# Patient Record
Sex: Female | Born: 1970 | Race: White | Hispanic: No | Marital: Single | Smoking: Current some day smoker
Health system: Southern US, Community
[De-identification: ages and names within clinical notes are randomized; demographics above are authoritative.]

## PROBLEM LIST (undated history)

## (undated) DIAGNOSIS — I10 Essential (primary) hypertension: Secondary | ICD-10-CM

## (undated) DIAGNOSIS — R011 Cardiac murmur, unspecified: Secondary | ICD-10-CM

## (undated) DIAGNOSIS — R12 Heartburn: Secondary | ICD-10-CM

## (undated) DIAGNOSIS — T7840XA Allergy, unspecified, initial encounter: Secondary | ICD-10-CM

## (undated) DIAGNOSIS — Z86718 Personal history of other venous thrombosis and embolism: Secondary | ICD-10-CM

## (undated) DIAGNOSIS — D689 Coagulation defect, unspecified: Secondary | ICD-10-CM

## (undated) DIAGNOSIS — E119 Type 2 diabetes mellitus without complications: Secondary | ICD-10-CM

## (undated) DIAGNOSIS — K219 Gastro-esophageal reflux disease without esophagitis: Secondary | ICD-10-CM

## (undated) DIAGNOSIS — G4730 Sleep apnea, unspecified: Secondary | ICD-10-CM

## (undated) DIAGNOSIS — K469 Unspecified abdominal hernia without obstruction or gangrene: Secondary | ICD-10-CM

## (undated) DIAGNOSIS — M549 Dorsalgia, unspecified: Secondary | ICD-10-CM

## (undated) DIAGNOSIS — R7309 Other abnormal glucose: Secondary | ICD-10-CM

## (undated) DIAGNOSIS — F32A Depression, unspecified: Secondary | ICD-10-CM

## (undated) DIAGNOSIS — R7303 Prediabetes: Secondary | ICD-10-CM

## (undated) HISTORY — DX: Allergy, unspecified, initial encounter: T78.40XA

## (undated) HISTORY — DX: Gastro-esophageal reflux disease without esophagitis: K21.9

## (undated) HISTORY — DX: Sleep apnea, unspecified: G47.30

## (undated) HISTORY — PX: OTHER SURGICAL HISTORY: SHX169

## (undated) HISTORY — DX: Personal history of other venous thrombosis and embolism: Z86.718

## (undated) HISTORY — PX: TUBAL LIGATION: SHX77

## (undated) HISTORY — DX: Coagulation defect, unspecified: D68.9

## (undated) HISTORY — DX: Cardiac murmur, unspecified: R01.1

## (undated) HISTORY — DX: Heartburn: R12

## (undated) HISTORY — DX: Dorsalgia, unspecified: M54.9

## (undated) HISTORY — DX: Other abnormal glucose: R73.09

## (undated) HISTORY — DX: Depression, unspecified: F32.A

## (undated) HISTORY — DX: Prediabetes: R73.03

---

## 2004-07-31 ENCOUNTER — Emergency Department: Payer: Self-pay | Admitting: General Practice

## 2005-03-17 ENCOUNTER — Other Ambulatory Visit: Payer: Self-pay

## 2005-03-17 ENCOUNTER — Inpatient Hospital Stay: Payer: Self-pay | Admitting: Internal Medicine

## 2006-10-16 ENCOUNTER — Emergency Department: Payer: Self-pay | Admitting: Emergency Medicine

## 2006-12-01 ENCOUNTER — Emergency Department: Payer: Self-pay | Admitting: Emergency Medicine

## 2006-12-18 ENCOUNTER — Encounter: Payer: Self-pay | Admitting: Obstetrics and Gynecology

## 2006-12-25 ENCOUNTER — Encounter: Payer: Self-pay | Admitting: Maternal & Fetal Medicine

## 2007-01-08 ENCOUNTER — Encounter: Payer: Self-pay | Admitting: Maternal & Fetal Medicine

## 2007-02-22 ENCOUNTER — Observation Stay: Payer: Self-pay | Admitting: Obstetrics & Gynecology

## 2007-03-16 ENCOUNTER — Encounter: Payer: Self-pay | Admitting: Maternal & Fetal Medicine

## 2007-04-03 ENCOUNTER — Observation Stay: Payer: Self-pay

## 2007-04-09 ENCOUNTER — Encounter: Payer: Self-pay | Admitting: Maternal & Fetal Medicine

## 2007-05-11 ENCOUNTER — Encounter: Payer: Self-pay | Admitting: Maternal & Fetal Medicine

## 2007-05-14 ENCOUNTER — Encounter: Payer: Self-pay | Admitting: Maternal and Fetal Medicine

## 2007-05-22 ENCOUNTER — Inpatient Hospital Stay: Payer: Self-pay | Admitting: Obstetrics and Gynecology

## 2007-07-16 ENCOUNTER — Ambulatory Visit: Payer: Self-pay | Admitting: Obstetrics and Gynecology

## 2012-12-10 ENCOUNTER — Emergency Department: Payer: Self-pay | Admitting: Emergency Medicine

## 2012-12-10 LAB — COMPREHENSIVE METABOLIC PANEL
Albumin: 3.5 g/dL (ref 3.4–5.0)
Alkaline Phosphatase: 71 U/L (ref 50–136)
BUN: 15 mg/dL (ref 7–18)
Bilirubin,Total: 0.3 mg/dL (ref 0.2–1.0)
Calcium, Total: 8.7 mg/dL (ref 8.5–10.1)
Chloride: 105 mmol/L (ref 98–107)
Creatinine: 0.56 mg/dL — ABNORMAL LOW (ref 0.60–1.30)
EGFR (African American): >60
EGFR (Non-African Amer.): >60
Glucose: 90 mg/dL (ref 65–99)
Osmolality: 276 (ref 275–301)
Potassium: 3.8 mmol/L (ref 3.5–5.1)
SGPT (ALT): 25 U/L (ref 12–78)
Sodium: 138 mmol/L (ref 136–145)
Total Protein: 6.9 g/dL (ref 6.4–8.2)

## 2012-12-10 LAB — URINALYSIS, COMPLETE
Glucose,UR: NEGATIVE mg/dL (ref 0–75)
Ketone: NEGATIVE
Ph: 7 (ref 4.5–8.0)
Protein: NEGATIVE
RBC,UR: 9 /HPF (ref 0–5)
Specific Gravity: 1.018 (ref 1.003–1.030)
WBC UR: 58 /HPF (ref 0–5)

## 2012-12-10 LAB — CBC
MCH: 31.2 pg (ref 26.0–34.0)
MCHC: 34 g/dL (ref 32.0–36.0)
Platelet: 343 10*3/uL (ref 150–440)
RBC: 4.9 10*6/uL (ref 3.80–5.20)
RDW: 13.7 % (ref 11.5–14.5)

## 2012-12-10 LAB — HEMOGLOBIN: HGB: 14.8 g/dL (ref 12.0–16.0)

## 2013-11-29 ENCOUNTER — Emergency Department: Payer: Self-pay | Admitting: Emergency Medicine

## 2013-11-29 LAB — URINALYSIS, COMPLETE
BILIRUBIN, UR: NEGATIVE
Glucose,UR: NEGATIVE mg/dL (ref 0–75)
Ketone: NEGATIVE
NITRITE: NEGATIVE
Ph: 6 (ref 4.5–8.0)
Protein: NEGATIVE
RBC,UR: 6 /HPF (ref 0–5)
SPECIFIC GRAVITY: 1.024 (ref 1.003–1.030)
Squamous Epithelial: 3

## 2013-11-29 LAB — BASIC METABOLIC PANEL
Anion Gap: 4 — ABNORMAL LOW (ref 7–16)
BUN: 12 mg/dL (ref 7–18)
Calcium, Total: 8.2 mg/dL — ABNORMAL LOW (ref 8.5–10.1)
Chloride: 109 mmol/L — ABNORMAL HIGH (ref 98–107)
Co2: 28 mmol/L (ref 21–32)
Creatinine: 0.72 mg/dL (ref 0.60–1.30)
EGFR (African American): >60
EGFR (Non-African Amer.): >60
GLUCOSE: 90 mg/dL (ref 65–99)
OSMOLALITY: 281 (ref 275–301)
Potassium: 3.8 mmol/L (ref 3.5–5.1)
Sodium: 141 mmol/L (ref 136–145)

## 2013-11-29 LAB — CBC
HCT: 41.2 % (ref 35.0–47.0)
HGB: 13.8 g/dL (ref 12.0–16.0)
MCH: 31.5 pg (ref 26.0–34.0)
MCHC: 33.6 g/dL (ref 32.0–36.0)
MCV: 94 fL (ref 80–100)
PLATELETS: 286 10*3/uL (ref 150–440)
RBC: 4.4 10*6/uL (ref 3.80–5.20)
RDW: 13.9 % (ref 11.5–14.5)
WBC: 7 10*3/uL (ref 3.6–11.0)

## 2014-03-13 ENCOUNTER — Emergency Department: Payer: Self-pay | Admitting: Emergency Medicine

## 2014-03-13 LAB — BASIC METABOLIC PANEL
ANION GAP: 8 (ref 7–16)
BUN: 11 mg/dL (ref 7–18)
CHLORIDE: 106 mmol/L (ref 98–107)
CO2: 24 mmol/L (ref 21–32)
CREATININE: 0.68 mg/dL (ref 0.60–1.30)
Calcium, Total: 8.1 mg/dL — ABNORMAL LOW (ref 8.5–10.1)
EGFR (African American): >60
EGFR (Non-African Amer.): >60
Glucose: 90 mg/dL (ref 65–99)
Osmolality: 275 (ref 275–301)
POTASSIUM: 3.8 mmol/L (ref 3.5–5.1)
Sodium: 138 mmol/L (ref 136–145)

## 2014-03-13 LAB — CBC
HCT: 40.6 % (ref 35.0–47.0)
HGB: 13.6 g/dL (ref 12.0–16.0)
MCH: 31.6 pg (ref 26.0–34.0)
MCHC: 33.5 g/dL (ref 32.0–36.0)
MCV: 94 fL (ref 80–100)
Platelet: 315 10*3/uL (ref 150–440)
RBC: 4.3 10*6/uL (ref 3.80–5.20)
RDW: 14 % (ref 11.5–14.5)
WBC: 8.7 10*3/uL (ref 3.6–11.0)

## 2014-03-13 LAB — D-DIMER(ARMC): D-DIMER: 773 ng/mL

## 2014-03-13 LAB — TROPONIN I: Troponin-I: <0.02 ng/mL

## 2014-03-27 ENCOUNTER — Emergency Department: Payer: Self-pay | Admitting: Emergency Medicine

## 2014-03-27 LAB — URINALYSIS, COMPLETE
BACTERIA: NONE SEEN
Bilirubin,UR: NEGATIVE
Bilirubin,UR: NEGATIVE
GLUCOSE, UR: NEGATIVE mg/dL (ref 0–75)
Glucose,UR: NEGATIVE mg/dL (ref 0–75)
KETONE: NEGATIVE
KETONE: NEGATIVE
Leukocyte Esterase: NEGATIVE
Leukocyte Esterase: NEGATIVE
NITRITE: NEGATIVE
Nitrite: NEGATIVE
PROTEIN: NEGATIVE
Ph: 6 (ref 4.5–8.0)
Ph: 5 (ref 4.5–8.0)
Protein: NEGATIVE
RBC,UR: 10 /HPF (ref 0–5)
SPECIFIC GRAVITY: 1.025 (ref 1.003–1.030)
Specific Gravity: 1.025 (ref 1.003–1.030)
Squamous Epithelial: 1
WBC UR: 6 /HPF (ref 0–5)

## 2014-03-27 LAB — CBC WITH DIFFERENTIAL/PLATELET
BASOS ABS: 0.1 10*3/uL (ref 0.0–0.1)
Basophil %: 0.5 %
Eosinophil #: 0.2 10*3/uL (ref 0.0–0.7)
Eosinophil %: 1.4 %
HCT: 38 % (ref 35.0–47.0)
HGB: 12.9 g/dL (ref 12.0–16.0)
Lymphocyte #: 1.8 10*3/uL (ref 1.0–3.6)
Lymphocyte %: 15.6 %
MCH: 32 pg (ref 26.0–34.0)
MCHC: 34 g/dL (ref 32.0–36.0)
MCV: 94 fL (ref 80–100)
MONOS PCT: 7.2 %
Monocyte #: 0.8 x10 3/mm (ref 0.2–0.9)
Neutrophil #: 8.9 10*3/uL — ABNORMAL HIGH (ref 1.4–6.5)
Neutrophil %: 75.3 %
Platelet: 310 10*3/uL (ref 150–440)
RBC: 4.04 10*6/uL (ref 3.80–5.20)
RDW: 13.8 % (ref 11.5–14.5)
WBC: 11.8 10*3/uL — ABNORMAL HIGH (ref 3.6–11.0)

## 2014-03-27 LAB — COMPREHENSIVE METABOLIC PANEL
ALBUMIN: 2.8 g/dL — AB (ref 3.4–5.0)
ALK PHOS: 52 U/L
ALT: 25 U/L
ANION GAP: 10 (ref 7–16)
AST: 18 U/L (ref 15–37)
BUN: 14 mg/dL (ref 7–18)
Bilirubin,Total: 0.1 mg/dL — ABNORMAL LOW (ref 0.2–1.0)
Calcium, Total: 8.7 mg/dL (ref 8.5–10.1)
Chloride: 107 mmol/L (ref 98–107)
Co2: 21 mmol/L (ref 21–32)
Creatinine: 0.77 mg/dL (ref 0.60–1.30)
GLUCOSE: 125 mg/dL — AB (ref 65–99)
OSMOLALITY: 278 (ref 275–301)
Potassium: 3.3 mmol/L — ABNORMAL LOW (ref 3.5–5.1)
Sodium: 138 mmol/L (ref 136–145)
Total Protein: 6.5 g/dL (ref 6.4–8.2)

## 2014-03-27 LAB — HCG, QUANTITATIVE, PREGNANCY: BETA HCG, QUANT.: 82695 m[IU]/mL — AB

## 2014-03-28 LAB — WET PREP, GENITAL

## 2014-04-18 ENCOUNTER — Encounter: Payer: Self-pay | Admitting: Obstetrics and Gynecology

## 2014-04-25 ENCOUNTER — Encounter: Payer: Self-pay | Admitting: Maternal & Fetal Medicine

## 2014-04-25 LAB — CBC WITH DIFFERENTIAL/PLATELET
Basophil #: 0.1 10*3/uL (ref 0.0–0.1)
Basophil %: 1.1 %
EOS PCT: 1.5 %
Eosinophil #: 0.1 10*3/uL (ref 0.0–0.7)
HCT: 38.9 % (ref 35.0–47.0)
HGB: 13.1 g/dL (ref 12.0–16.0)
Lymphocyte #: 1.8 10*3/uL (ref 1.0–3.6)
Lymphocyte %: 21.6 %
MCH: 31.4 pg (ref 26.0–34.0)
MCHC: 33.6 g/dL (ref 32.0–36.0)
MCV: 94 fL (ref 80–100)
MONOS PCT: 9.7 %
Monocyte #: 0.8 x10 3/mm (ref 0.2–0.9)
Neutrophil #: 5.5 10*3/uL (ref 1.4–6.5)
Neutrophil %: 66.1 %
PLATELETS: 303 10*3/uL (ref 150–440)
RBC: 4.16 10*6/uL (ref 3.80–5.20)
RDW: 13.2 % (ref 11.5–14.5)
WBC: 8.4 10*3/uL (ref 3.6–11.0)

## 2014-04-25 LAB — HEPARIN LEVEL (UNFRACTIONATED): Anti-Xa(Unfractionated): 0.11 IU/mL — ABNORMAL LOW (ref 0.30–0.70)

## 2014-05-05 ENCOUNTER — Encounter: Payer: Self-pay | Admitting: Maternal & Fetal Medicine

## 2014-05-05 LAB — CBC WITH DIFFERENTIAL/PLATELET
BASOS PCT: 0.9 %
Basophil #: 0.1 10*3/uL (ref 0.0–0.1)
EOS PCT: 2.1 %
Eosinophil #: 0.2 10*3/uL (ref 0.0–0.7)
HCT: 38.2 % (ref 35.0–47.0)
HGB: 12.7 g/dL (ref 12.0–16.0)
Lymphocyte #: 1.8 10*3/uL (ref 1.0–3.6)
Lymphocyte %: 19.4 %
MCH: 30.9 pg (ref 26.0–34.0)
MCHC: 33.2 g/dL (ref 32.0–36.0)
MCV: 93 fL (ref 80–100)
MONO ABS: 0.9 x10 3/mm (ref 0.2–0.9)
Monocyte %: 10.2 %
Neutrophil #: 6.1 10*3/uL (ref 1.4–6.5)
Neutrophil %: 67.4 %
PLATELETS: 304 10*3/uL (ref 150–440)
RBC: 4.1 10*6/uL (ref 3.80–5.20)
RDW: 13.4 % (ref 11.5–14.5)
WBC: 9 10*3/uL (ref 3.6–11.0)

## 2014-05-19 ENCOUNTER — Encounter: Payer: Self-pay | Admitting: Obstetrics & Gynecology

## 2014-06-06 ENCOUNTER — Encounter: Payer: Self-pay | Admitting: Maternal & Fetal Medicine

## 2014-08-01 ENCOUNTER — Encounter: Payer: Self-pay | Admitting: Obstetrics and Gynecology

## 2014-08-19 ENCOUNTER — Observation Stay: Payer: Self-pay | Admitting: Obstetrics and Gynecology

## 2014-08-19 LAB — URINALYSIS, COMPLETE
BILIRUBIN, UR: NEGATIVE
BLOOD: NEGATIVE
GLUCOSE, UR: NEGATIVE mg/dL (ref 0–75)
LEUKOCYTE ESTERASE: NEGATIVE
Nitrite: NEGATIVE
Ph: 6 (ref 4.5–8.0)
RBC,UR: 12 /HPF (ref 0–5)
SPECIFIC GRAVITY: 1.024 (ref 1.003–1.030)
WBC UR: 4 /HPF (ref 0–5)

## 2014-08-19 LAB — WET PREP, GENITAL

## 2014-08-26 ENCOUNTER — Observation Stay: Payer: Self-pay | Admitting: *Deleted

## 2014-08-26 LAB — URINALYSIS, COMPLETE
BILIRUBIN, UR: NEGATIVE
GLUCOSE, UR: NEGATIVE mg/dL (ref 0–75)
LEUKOCYTE ESTERASE: NEGATIVE
NITRITE: NEGATIVE
PROTEIN: NEGATIVE
Ph: 6 (ref 4.5–8.0)
RBC,UR: 10 /HPF (ref 0–5)
Specific Gravity: 1.024 (ref 1.003–1.030)
Squamous Epithelial: 12
WBC UR: 2 /HPF (ref 0–5)

## 2014-09-12 ENCOUNTER — Ambulatory Visit: Payer: Self-pay | Admitting: Obstetrics and Gynecology

## 2014-09-29 ENCOUNTER — Observation Stay: Payer: Self-pay | Admitting: Obstetrics and Gynecology

## 2014-09-29 LAB — PIH PROFILE
AST: 16 U/L (ref 15–37)
Anion Gap: 8 (ref 7–16)
BUN: 11 mg/dL (ref 7–18)
CHLORIDE: 107 mmol/L (ref 98–107)
CREATININE: 0.46 mg/dL — AB (ref 0.60–1.30)
Calcium, Total: 8.4 mg/dL — ABNORMAL LOW (ref 8.5–10.1)
Co2: 25 mmol/L (ref 21–32)
GLUCOSE: 85 mg/dL (ref 65–99)
HCT: 34.4 % — AB (ref 35.0–47.0)
HGB: 11.2 g/dL — AB (ref 12.0–16.0)
MCH: 27 pg (ref 26.0–34.0)
MCHC: 32.6 g/dL (ref 32.0–36.0)
MCV: 83 fL (ref 80–100)
OSMOLALITY: 278 (ref 275–301)
PLATELETS: 281 10*3/uL (ref 150–440)
Potassium: 4.3 mmol/L (ref 3.5–5.1)
RBC: 4.14 10*6/uL (ref 3.80–5.20)
RDW: 15.5 % — ABNORMAL HIGH (ref 11.5–14.5)
Sodium: 140 mmol/L (ref 136–145)
URIC ACID: 4.1 mg/dL (ref 2.6–6.0)
WBC: 8.9 10*3/uL (ref 3.6–11.0)

## 2014-09-29 LAB — PROTEIN / CREATININE RATIO, URINE
Creatinine, Urine: 110.9 mg/dL (ref 30.0–125.0)
Protein, Random Urine: 29 mg/dL — ABNORMAL HIGH (ref 0–12)
Protein/Creat. Ratio: 261 mg/gCREAT — ABNORMAL HIGH (ref 0–200)

## 2014-10-03 ENCOUNTER — Inpatient Hospital Stay: Payer: Self-pay

## 2014-10-03 LAB — CBC WITH DIFFERENTIAL/PLATELET
BASOS ABS: 0 10*3/uL (ref 0.0–0.1)
Basophil %: 0.5 %
EOS ABS: 0.1 10*3/uL (ref 0.0–0.7)
Eosinophil %: 1 %
HCT: 33.3 % — ABNORMAL LOW (ref 35.0–47.0)
HGB: 10.9 g/dL — ABNORMAL LOW (ref 12.0–16.0)
LYMPHS PCT: 18.7 %
Lymphocyte #: 1.7 10*3/uL (ref 1.0–3.6)
MCH: 27 pg (ref 26.0–34.0)
MCHC: 32.8 g/dL (ref 32.0–36.0)
MCV: 82 fL (ref 80–100)
MONOS PCT: 11.8 %
Monocyte #: 1.1 x10 3/mm — ABNORMAL HIGH (ref 0.2–0.9)
NEUTROS ABS: 6.1 10*3/uL (ref 1.4–6.5)
NEUTROS PCT: 68 %
PLATELETS: 292 10*3/uL (ref 150–440)
RBC: 4.05 10*6/uL (ref 3.80–5.20)
RDW: 16.1 % — AB (ref 11.5–14.5)
WBC: 9 10*3/uL (ref 3.6–11.0)

## 2014-10-03 LAB — PROTEIN / CREATININE RATIO, URINE
Creatinine, Urine: 193.6 mg/dL — ABNORMAL HIGH (ref 30.0–125.0)
PROTEIN/CREAT. RATIO: 207 mg/g{creat} — AB (ref 0–200)
Protein, Random Urine: 40 mg/dL — ABNORMAL HIGH (ref 0–12)

## 2014-10-03 LAB — PROTIME-INR
INR: 0.9
Prothrombin Time: 12.7 secs

## 2014-10-03 LAB — APTT

## 2014-11-08 ENCOUNTER — Ambulatory Visit: Payer: Self-pay | Admitting: Obstetrics & Gynecology

## 2014-11-11 ENCOUNTER — Ambulatory Visit: Payer: Self-pay | Admitting: Obstetrics & Gynecology

## 2014-12-17 NOTE — Consult Note (Signed)
Referral Information:  Reason for Referral Pregnant, history of Pulmonary embolism   Referring Physician [facility]   Prenatal Hx Sabrina Fuentes is a 44 year-old G5 P3104 at 76 1/7 weeks (by 9 wk [place]) who presents for recommendations concerning the management of anticoagulation in pregnancy. She was followed by our group in 2008 during her most recent pregnancy. During that pregnancy she utilized an intermediate dosing of Lovenox (30 mg every 12 hours, increasing to 40 mg every 12 hours and transitioned to UFH at 36 wks) without difficulty. She had no bleeding comlications and did not have recurrence of VTE.  She presented in 2006 to [facility] with a pulmonary embolism. She was admitted for one week, initially maintained on Lovenox and then completed six months of coumadin anticoagulation. At the time of presentation, she was smoking and on combination contraception. Her thrombophilia evaluation was negative.  Currently, she is doing well. She had an [place] with [facility] which documented a live IUP. She denies vaginal bleeding or abdominal pain.   Past Obstetrical Hx G5 P3104 1997: spontaneous vaginal delivery at 38 weeks, spontaneous labor. Female infant, 7 pounds, no complications 7169: spontaneous vaginal delivery at 36 weeks, spontaneous labor, female infant, 6 pounds 2 ounces, no complications 6789: spontaneous vaginal delivery at 39 weeks, spontaneous labor, female infant, 8 pounds, no complications 3810: spontaneous vaginal delivery at 39 weeks, Induction of labor due to anticoagulation, Female infant, 7 pounds 1 ounce, no complications.   Home Medications: Medication Instructions Status  Prenatal Multivitamins Prenatal Multivitamins oral tablet 1 tab(s) orally once a day Active   Allergies:   Penicillin: Hives  IVP Dye: Hives  Ampicillin: Hives  ASA: Unknown  Vital Signs/Notes:  Nursing Vital Signs: **Vital Signs.:   24-Aug-15 09:15  Pulse Pulse 93  Systolic BP Systolic BP 175  Diastolic BP  (mmHg) Diastolic BP (mmHg) 55  Pulse Ox % Pulse Ox % 98   Perinatal Consult:  PGyn Hx Denies history of abnormal paps, STDS, or procedurs on cervix   Past Medical History cont'd Pulmonary Embolism as in HPI Denies history of hypertension, diabetes or thyroid disorders   PSurg Hx Tonsillectomy as teenager. No abdominal surgery   FHx Deneis FH of birth defects, mental retardation or genetic disorders.  Maternal Grandmother with multiple strokes and PE at old age. No other FH of VTE   Occupation [employer]   Soc Hx Denies use of ETOH or drugs. Smoking 2-3 cigs per day.   Review Of Systems:  Subjective No complaints. No vaginal bleeding. No abdominal pain.   Fever/Chills No   Abdominal Pain No   Nausea/Vomiting No   SOB/DOE No   Chest Pain No   Exam:  Today's Weight 259, BMI 44.4    Additional Lab/Radiology Notes Prenatal labs [facility],  8/17-8/18/15): Blood type A positive, antibody screen negative, RPR non-reactive, Hep B neg, HIV non-reactive, Rubella immune, Hct 38.1, Plt 361, MCV 93 Early glucola 113 Cell-free fetal DNA: Normal, findings consistent with female fetus   Impression/Recommendations:  Impression 44 year-old G5 P3104 at 55 1/7 weeks with history of pulmonary embolism while on combination oral contraception in 2006. She had a successful pregnancy in 2008 while on an intermediate dosing of Lovenox. In addition, she is 44 years-old (Advanced Maternal Age) and obese (BMI over 40).   Recommendations 1. Advanced Maternal Age at 65. We discussed the age-associated risk for fetal aneuploidy and available screening and diagnostic tests avaialble, including amniocentesis. We also discussed availability of genetic counseling.  She had a normal cell-free fetal DNA testing performed at [place]. She declines genetic counseling. We discussed false-positive and false-negative reates associated with cell-free fetal DNA testing.  We also discussed medical  complications in pregnancy associated with Advanced Maternal Age including diabetes and hypertensive disorders of pregnancy. She reports having had a normal early glucola this pregnancy at [place]. -Declines Genetic Counseling -We have scheduled a detailed [place] to be done at 17-18 weeks at [facility]. Cell-free fetal DNA testing is negative.  2. Obesity. We discussed complications associated with obesity in pregnancy and goal weight gain.  ...continued below....    Comments ...continued from above....  3. History of Pulmonary Embolism. Pt has a history of pulmonary embolism in 2006 while smoking and taking combination oral contraception. She did well with intermediate dosing of Lovenox during her pregnancy in 2008. As her BMI is 44, I am recommended utilizing an intermediate dosing of Lovenox again. We discussed risks and benefits of anticoagulation in pregnancy.  She had a negative inherited and acquired thrombophila panel with [place] in 2008 -STOP smoking. We discussed the increased risk for poor pregnancy outcomes as well as recurrent VTE with continued smoking. -Start Lovenox 30 mg every 12 hours today. A script was provded and she declined teaching as she had used prior -RTC in one week for CBC and LMWH level -RTC in two weeks for CBC -RTC at 28 weeks to increase her Lovenox to 40 mg every 12 hours. This appointment was provided -We will then transition her to unfractionated heparin at 36 weeks -Plan delivery at 39 weeks, having held her heparin the mornign of induction -SCDs on in labor -Restart Lovenox at 40 mg once daily, 12 hours after a vaginal delivery or 24 hours after a cesarean delivery. Continue for six weeks postpartum -Avoid estrogen-containting contraception -Follow fetal growth with monthly ultrasounds starting at about 28 weeks -Weekly fetal testing to begin at 32 weeks, increasing to twice weekly at 34-36 weeks -Delivery in her 39th week as  above    Total Time Spent with Patient 60 minutes   >50% of visit spent in couseling/coordination of care yes   Office Use Only 99244  Level 4 (85min) NEW office consult low complexity   Coding Description: MATERNAL CONDITIONS/HISTORY INDICATION(S).   Bleeding Disorder and/or Pt on heparin/coumadin/lovenox.   Pulmonary emboli.  Electronic Signatures: Dessirae Scarola, [place] (MD)  (Signed 24-Aug-15 10:50)  Authored: Referral, Home Medications, Allergies, Vital Signs/Notes, Consult, Exam, Lab/Radiology Notes, Impression, Other Comments, Billing, Coding Description   Last Updated: 24-Aug-15 10:50 by Treva Huyett, [place] (MD)

## 2014-12-17 NOTE — Consult Note (Signed)
Referral Information:  Reason for Referral Pregnant, history of Pulmonary embolism, smoker, obesity   Referring Physician [facility]   Prenatal Hx Sabrina Fuentes is a 44 year-old G5 P3104 at 53 1/7 weeks (by 9 wk [place]) who presents for follow up  concerning the management of anticoagulation in pregnancy. She saw Dr Claybon Jabs at 13 weeks, who recommended management similar to her prior pregnancy given multiple risk factors. She was followed by our group in 2008 during her last pregnancy. During that pregnancy she utilized an intermediate dosing of Lovenox (30 mg every 12 hours, increasing to 40 mg every 12 hours and transitioned to UFH at 36 wks) without difficulty. She had no bleeding comlications and did not have recurrence of VTE. She reports no bleedign or complicaitons of Lovenox use. She presented in 2006 to [facility] with a pulmonary embolism. She was admitted for one week, initially maintained on Lovenox and then completed six months of coumadin anticoagulation. At the time of presentation, she was smoking and on combination contraception. Her thrombophilia evaluation was negative.  Today she will undergo her growth scan at [facility] and will have her third trimester labs. She reports a 29 lb weight gain - she is puzzle das she tries to eat healthy food. She smokes 2 cigs/day - her husband continues to smoke which is an obstacle to quitting . She declines further assistance with [place] or other options.   Past Obstetrical Hx G5 P3104 1997: spontaneous vaginal delivery at 38 weeks, spontaneous labor. Female infant, 7 pounds, no complications 7564: spontaneous vaginal delivery at 36 weeks, spontaneous labor, female infant, 6 pounds 2 ounces, no complications 3329: spontaneous vaginal delivery at 39 weeks, spontaneous labor, female infant, 8 pounds, no complications 5188: spontaneous vaginal delivery at 39 weeks, Induction of labor due to anticoagulation, Female infant, 7 pounds 1 ounce, no complications.    Home Medications: Medication Instructions Status  Prenatal Multivitamins Prenatal Multivitamins oral tablet 1 tab(s) orally once a day Active  Lovenox 30 mg/0.3 mL injectable solution 30 milligram(s) injectable 2 times a day Active  Zantac 150 150 mg oral tablet 1 tab(s) orally 2 times a day, As Needed - for Itching, for Indigestion, Heartburn  Active   Allergies:   Penicillin: Hives  IVP Dye: Hives  Ampicillin: Hives  ASA: Unknown  Vital Signs/Notes:  Nursing Vital Signs:  **Vital Signs.:   07-Dec-15 11:05  Vital Signs Type Routine  Temperature Temperature (F) 97.8  Celsius 36.5  Temperature Source oral  Pulse Pulse 102  Respirations Respirations 18  Systolic BP Systolic BP 416  Diastolic BP (mmHg) Diastolic BP (mmHg) 68  Mean BP 88  Pulse Ox % Pulse Ox % 97  Pulse Ox Activity Level  At rest  Oxygen Delivery Room Air/ 21 %   Perinatal Consult:  PGyn Hx Denies history of abnormal paps, STDS, or procedurs on cervix   Past Medical History cont'd Pulmonary Embolism as in HPI Denies history of hypertension, diabetes or thyroid disorders   PSurg Hx Tonsillectomy as teenager. No abdominal surgery   FHx Deneis FH of birth defects, mental retardation or genetic disorders.  Maternal Grandmother with multiple strokes and PE at old age. No other FH of VTE   Occupation [employer]   Soc Hx Denies use of ETOH or drugs. Smoking 2/ cigs per day.   Review Of Systems:  Subjective No complaints. No vaginal bleeding. No abdominal pain.      SOB/DOE No   Chest Pain No   Exam:  Today's Weight 288 29 lb incre    Additional Lab/Radiology Notes Prenatal labs [facility],  8/17-8/18/15): Blood type A positive, antibody screen negative, RPR non-reactive, Hep B neg, HIV non-reactive, Rubella immune, Hct 38.1, Plt 361, MCV 93 Early glucola 113 Cell-free fetal DNA: Normal, findings consistent with female fetus last LMWH level 0.11 - (0,3-0.7) low as antivcipated for  prophylactic dosing   Impression/Recommendations:  Impression 44 year-old G5 P3104 at 36 1/7 weeks with: 1- history of pulmonary embolism while on combination oral contraception in 2006. She had a successful pregnancy in 2008 while on an intermediate dosing of Lovenox. In addition, she is 44 years-old (Advanced Maternal Age) and obese (BMI over 40). Plan for intermediate dosing this pregnancy . Here to increase dosing  2 Adv Mat age - normal cell free DNA - female  3 Obesity - greater than advised weight gain - declines dietitian referral  4 Smoker has cut back to 2/day - declines further assistance   Recommendations 1 Increase Lovenox dose to 40 mg BID - recheck level and plt count in one week - put on Rx to have it doen at [facility] - pt works at [employer] , will switch to unfractionated at 36 weeks and indcution after holding at 39 weeks  2- check TSH with GCT today - greater than expected weight gain, encouraged dietary limitation 3 encouraged limiting smoking  4 serial scans for growth   Plan:  Ultrasound at what gestational ages Monthly > 28 weeks   Antepartum Testing Weekly, Starting at 88 to 36 weeks   Delivery Mode Vaginal   Additional Testing Thyroid panel, LMWH level /CBC in one week   Delivery at what gestational age [redacted] weeks   (Removed):     Total Time Spent with Patient 15 minutes   >50% of visit spent in couseling/coordination of care yes   Office Use Only 99213  Office Visit Level 3 (73min) EST exp prob focused outpt   Coding Description: MATERNAL CONDITIONS/HISTORY INDICATION(S).   Bleeding Disorder and/or Pt on heparin/coumadin/lovenox.   Pulmonary emboli.  Electronic Signatures: Sharyn Creamer (MD)  (Signed 07-Dec-15 11:59)  Authored: Referral, Home Medications, Allergies, Vital Signs/Notes, Consult, Exam, Lab/Radiology Notes, Impression, Plan, Other Comments, Billing, Coding Description   Last Updated: 07-Dec-15 11:59 by Sharyn Creamer (MD)

## 2014-12-19 LAB — SURGICAL PATHOLOGY

## 2014-12-25 NOTE — Op Note (Signed)
PATIENT NAME:  Sabrina Fuentes, Sabrina Fuentes MR#:  [id number] DATE OF BIRTH:  04/07/1971  DATE OF PROCEDURE:  11/11/2014  PREOPERATIVE DIAGNOSIS: Desired sterilization.   POSTOPERATIVE DIAGNOSIS:  Desired sterilization.  PROCEDURE:  A single incision laparoscopic bilateral salpingectomy  ANESTHESIA:  General.   SURGEON: Dr. Vikki Ports Ward.  ESTIMATED BLOOD LOSS: Minimal.   OPERATIVE FLUIDS:  1000 mL of crystalloid.   FINDINGS: Normal-appearing uterus, tubes and ovaries.   SPECIMENS: Right tube and left tube.   COMPLICATIONS: None apparent.   DISPOSITION: Stable to PACU.   INDICATION FOR PROCEDURE: Ms. Weinhold is a 44 year old female who is recently status post a spontaneous vaginal delivery. She was not eligible for a postpartum tubal ligation, however.  She elected for an interim tubal ligation via laparoscopic approach. She had consented to this procedure was brought in on the day of surgery.   OPERATIVE NOTE: The patient was brought back to the operating room where she was identified as Sabrina Fuentes.  She was given endotracheal intubation for general anesthesia delivery and placed in the dorsal lithotomy position in [place]. She was prepped and draped in the usual sterile fashion and then a surgical timeout was called. The surgeon's hand was placed into the vagina and a single-tooth tenaculum was placed on the cervix. It was brought forward and then a uterine sound was placed into the uterus and taped to the tenaculum for retraction. A Foley catheter was then placed.  A change of gloves was performed and then the attention was turned to the abdominal cavity. The belly button was deep and Allis clamps were used to lift it, and an 11 blade was used to incise the skin in a vertical fashion through the belly button.  The underlying tissues were dissected and Kocher clamps were used to grasp the fascia which was divided with Mayo scissors, and then further divided cephalad and caudad in order to  accommodate the GelPOINT single site incision applicator. The Ubaldo Glassing was inserted and retracted and the GelPOINT device was attached. The pneumoperitoneum was created.  Cursory and brief evaluation of the upper abdominal cavity was negative for any interesting finding and the attention was turned towards the pelvis. The patient was placed in steep Trendelenburg. The uterus was retracted anteriorly, and the right fallopian tube was identified, grasped at its fimbriated end, and the LigaSure was then used to dissect the mesosalpinx to the cornua without difficulty.  This was placed in the anterior cul-de-sac, and then the attention was turned to the left tube which was again grasped at its fimbriated end, and the mesosalpinx from the fimbriated end to the cornua was then dissected using the LigaSure.  The peritoneum was then deflated and then re-inflated to assess for hemostasis. There was no bleeding at either site of the surgery. The tubes were then grasped and brought through the incision and handed off to nursing to be sent to pathology.  The GelPOINT device was then removed in its entirety. Kocher clamps were then used to grasp the fascia on both of the left and right side of the umbilicus. The fascia was then closed with a running stitch of 0 Vicryl, and then the skin was closed using 4-0 Monocryl.  It should be mentioned that there was a good bit of difficulty in closing the fascia as the patient's fascia was very deep; however, it was accomplished and the surgeon's finger tested integrity.  The patient tolerated the procedure. The counts were correct x 2, and then the patient  was brought back to PACU in a stable condition for recovery.    ____________________________ Honor Loh Ward, MD ccw:sp D: 11/13/2014 13:07:07 ET T: 11/13/2014 13:47:53 ET JOB#: [id number]  cc: Vikki Ports C. Ward, MD, <Dictator> Maceo Pro MD ELECTRONICALLY SIGNED 12/15/2014 23:42

## 2015-01-03 NOTE — H&P (Signed)
L&D Evaluation:  History:  HPI 44 yo [id number] WF with EDC=10/24/2014 by LMP/12 wk scan who presents at 37 weeks for IOL for gestational hypertension. Sent from office last week at 36.[redacted] weeks gestation after having a 6-7 minute deceleration during a NST. She also had an elevated blood pressure to 140s/90s. and recent onset headache. She was monitored x 4 hours and had a nicely reactive strip without further decels. She had some blood pressures in the mild range but PIH labs were normal. She also was begun on glyburide 2.5 mgm BID because the majority of her blood sugars were elevated on her GDM diet. Her PNC at [facility]  was also complicated by  Obesity with BMI of 45 and a 31# weight gain,  AMA (normal Informaseq),  history of PE in 2006 currently on intermediate-dosing lovenox at 40mg  bid, and smoking.  She denies current headache, visual changes, SOB, or CP. She has had irregular contractions, but no VB. Has had LOF with coughing but she thinks it is SUI.  She notes positive fetal movement. Has had congestion and a cough recently. Denies fever. Hx of SVD x4. LABS: A POS/RI/VI/ GBS positive   Presents with gestational hypertension for IOL   Patient's Medical History 1) morbid obesity, 2) AMA, 3) history of PE. 4)GDM   Patient's Surgical History Tonsilectomy   Medications Pre Natal Vitamins  Lovenox 40mg  BID ( last dose 2/7 AM), Zantac 150 mgm prn, glyburide 2.5 mgm (last dose 2/7 PM)   Allergies PCN, ASA, IVP dye   Social History tobacco  1/2 PPD   Family History Non-Contributory   ROS:  ROS see HPI   Exam:  Vital Signs 148/85, 148/66   General no apparent distress   Mental Status clear   Chest rhonchi in upper lobes resolved with cough   Heart normal sinus rhythm, no murmur/gallop/rubs   Abdomen gravid, non-tender   Estimated Fetal Weight 6#10 on recent ultrasound. AFI=19 09/26/14   Fetal Position vtx on [place] today   Edema 1+   Reflexes 2+   Pelvic no external lesions,  FT/25-50%/-4   Mebranes Intact   FHT normal rate with no decels, 135-140 with accels to 160s   FHT Description mod variability   Ucx occ   Skin dry   Impression:  Impression IUP at 37 weeks with GHTN-for IOL/unripe cx   Plan:  Plan antibiotics for GBBS prophylaxis, PIH labs   Comments Discussed method of IOL with Dr Leonides Schanz in light of prolonged fetal heart rate decel last week. Will start with Cervidil. Discussed with patient risks of hyperstimulation, FITL, Cesarean section, and failed IOL. Patient is advised that most probably will need serial induction given unripe cx. Patient wishes to proceed with induction.  2.) GDMA2-FSBS 2 hr pp was 117. Will get FSBS q2hr 3.) Hx of PE/on Lovenox-PT, PTT ordered. Start SCDs when on bedrest.   Electronic Signatures: Karene Fry (CNM)  (Signed 08-Feb-16 17:42)  Authored: L&D Evaluation   Last Updated: 08-Feb-16 17:42 by Karene Fry (CNM)

## 2015-01-03 NOTE — H&P (Signed)
L&D Evaluation:  History:  HPI 44 yo [id number] WF with EDC=10/24/2014 by LMP/12 wk scan who presents from office at 36.[redacted] weeks gestation after having a 6-7 minute deceleration in the office during a NST for extended monitoring. She also had an elevated blood pressure to 140s/90s. and a headache since yesterday. Her PNC at [facility] is complicated by  Obesity with BMI of 45 and a 31# weight gain,  AMA (normal Informaseq),  history of PE in 2006 currently on intermediate-dosing lovenox at 40mg  bid, and smoking. In addition she has GDM and the majority of FSBSs have been elevated per CNM [place].  She denies visual changes, SOB, or CP. She has had irregular contractions, but no VB. Has had LOF with coughing but she thinks it is SUI.  She notes positive fetal movement. Has had congestion and a cough recently. Denies fever. Hx of SVD x4. LABS: A POS/RI/VI   Presents with prolonged deceleration in office and elevated blood pressure   Patient's Medical History 1) morbid obesity, 2) AMA, 3) history of PE. 4)GDM   Patient's Surgical History Tonsilectomy   Medications Pre Natal Vitamins  Lovenox 40mg  BID, Zantac 150 mgm   Allergies PCN, ASA, IVP dye   Social History tobacco  1/2 PPD   Family History Non-Contributory   ROS:  ROS see HPI   Exam:  Vital Signs 146/75, 147/81, 153/87, 137/77, 143/78, 152/61   General no apparent distress   Mental Status clear   Chest wheezing in upper lobes bilaterally   Heart normal sinus rhythm, no murmur/gallop/rubs   Abdomen gravid, non-tender   Estimated Fetal Weight 6#10 on recent ultrasound. AFI=19 09/26/14   Edema 1+   Reflexes 2+   Pelvic no external lesions, cervix closed and thick, Baby OOP   Mebranes Intact   FHT normal rate with no decels   Ucx none seen   Skin dry   Impression:  Impression IUP at 36.3 weeks with prolonged decel in office-reactive tracing here so far.   Plan:  Comments Continue to monitor. 2) Elevated blood pressures in  the mild range. PIH workup. P/C ratio 3) GDMA-blood sugars elevated in office- consider starting glyburide 4) on Lovenox for hx of PE-SCDs if needing prolonged monitoring. 5.) NPO for now except ice chips-consider clear liquids if no further FHR decels   Electronic Signatures: Karene Fry (CNM)  (Signed 04-Feb-16 12:46)  Authored: L&D Evaluation   Last Updated: 04-Feb-16 12:46 by Karene Fry (CNM)

## 2015-01-03 NOTE — H&P (Signed)
L&D Evaluation:  History Expanded:  HPI 44 yo [id number] WF at [redacted]w[redacted]d gestational age by LMP/12 wk scan whose pregnancy is complicated by Obesity with BMI of 45, AMA, history of PE in 2006 currently on intermediate-dosing lovenox at 40mg  bid.  She presents with trickling of fluid about 1.5 hours prior to presentation.  It ran down her leg, she wiped it off, and it ran down her leg more.  She never noted a large gush of fluid nor vaginal bleeding. She deneis contractions.  She notes positive fetal movement.  She receives her prenatal care at [facility].   Gravida 5   Term 3   PreTerm 1   Abortion 0   Living 4   Blood Type (Maternal) A positive   Group B Strep Results Maternal (Result >5wks must be treated as unknown) unknown/result > 5 weeks ago   Maternal HIV Negative   Maternal Syphilis Ab Nonreactive   Maternal Varicella Immune   Rubella Results (Maternal) immune   EDC 24-Oct-2014   Patient's Medical History 1) morbid obesity, 2) AMA, 3) history of PE   Patient's Surgical History Tonsilectomy   Medications Pre Natal Vitamins  Lovenox 40mg  BID   Allergies NKDA   Social History PCN, ASA, IVP Dye   Family History Non-Contributory   Current Prenatal Course Notable For h/o PE   ROS:  ROS All systems were reviewed.  HEENT, CNS, GI, GU, Respiratory, CV, Renal and Musculoskeletal systems were found to be normal., unless noted in HPI   Exam:  Vital Signs stable  AFVSS   General no apparent distress   Mental Status clear   Chest clear   Heart normal sinus rhythm   Abdomen gravid, non-tender   Back no CVAT   Edema no edema   Pelvic no external lesions, FT/long/high,  Thick-whitish discharge   Mebranes Intact   FHT normal rate with no decels   FHT Description 145/mod var/+accels/no decels   Ucx absent   Skin dry   Other ROM eval: neg pool, neg fern, neg nitrazine  NST: 145/mod var/+accels/no decels, monitored for >30 minutes   Impression:   Impression reactive NST, 1) Intrauterine pregnancy at [redacted]w[redacted]d gestational age, 2) morbid obesity, 3) history of PE on lovenox, 4) no evidence of ROM   Plan:  Plan monitor contractions and for cervical change   Comments 1) ROM; no evidence of rom 2) U/A neg, wet prep neg 3) Fetal well being: reassuring for gestational age. - reactive 4) keep f/u already scheduled   Follow Up Appointment already scheduled   Labs:  Lab Results:  Routine Micro:  25-Dec-15 15:31   Micro Text Report WET PREP   COMMENT                   FEW WHITE BLOOD CELLS SEEN   COMMENT                   NO TRICHOMONAS,SPERMATOZOA,YEAST,OR CLUE CELLS SEEN   ANTIBIOTIC                       Comment 1. FEW WHITE BLOOD CELLS SEEN  Routine UA:  25-Dec-15 15:31   Color (UA) Yellow  Clarity (UA) Cloudy  Glucose (UA) Negative  Bilirubin (UA) Negative  Ketones (UA) 1+  Specific Gravity (UA) 1.024  Blood (UA) Negative  pH (UA) 6.0  Protein (UA) 30 mg/dL  Nitrite (UA) Negative  Leukocyte Esterase (UA) Negative (Result(s) reported on 19 Aug 2014 at 03:46PM.)  RBC (UA) 12 /HPF  WBC (UA) 4 /HPF  Bacteria (UA) 2+  Epithelial Cells (UA) 26 /HPF  Mucous (UA) PRESENT (Result(s) reported on 19 Aug 2014 at 03:46PM.)   Electronic Signatures: Will Bonnet (MD)  (Signed 25-Dec-15 16:31)  Authored: L&D Evaluation, Labs   Last Updated: 25-Dec-15 16:31 by Will Bonnet (MD)

## 2015-06-09 ENCOUNTER — Emergency Department: Payer: Self-pay

## 2015-06-09 ENCOUNTER — Encounter: Payer: Self-pay | Admitting: *Deleted

## 2015-06-09 ENCOUNTER — Emergency Department
Admission: EM | Admit: 2015-06-09 | Discharge: 2015-06-09 | Disposition: A | Discharge status: Home / Self Care | Payer: Self-pay | Attending: Emergency Medicine | Admitting: Emergency Medicine

## 2015-06-09 DIAGNOSIS — M546 Pain in thoracic spine: Secondary | ICD-10-CM | POA: Insufficient documentation

## 2015-06-09 DIAGNOSIS — J069 Acute upper respiratory infection, unspecified: Secondary | ICD-10-CM | POA: Insufficient documentation

## 2015-06-09 DIAGNOSIS — Z720 Tobacco use: Secondary | ICD-10-CM | POA: Insufficient documentation

## 2015-06-09 DIAGNOSIS — R109 Unspecified abdominal pain: Secondary | ICD-10-CM

## 2015-06-09 DIAGNOSIS — R1031 Right lower quadrant pain: Secondary | ICD-10-CM | POA: Insufficient documentation

## 2015-06-09 DIAGNOSIS — Z880 Allergy status to penicillin: Secondary | ICD-10-CM | POA: Insufficient documentation

## 2015-06-09 LAB — COMPREHENSIVE METABOLIC PANEL
ALBUMIN: 3.9 g/dL (ref 3.5–5.0)
ALK PHOS: 60 U/L (ref 38–126)
ALT: 16 U/L (ref 14–54)
AST: 21 U/L (ref 15–41)
Anion gap: 5 (ref 5–15)
BUN: 10 mg/dL (ref 6–20)
CALCIUM: 9.1 mg/dL (ref 8.9–10.3)
CO2: 28 mmol/L (ref 22–32)
CREATININE: 0.75 mg/dL (ref 0.44–1.00)
Chloride: 105 mmol/L (ref 101–111)
GFR calc Af Amer: >60 mL/min (ref >60)
GFR calc non Af Amer: >60 mL/min (ref >60)
GLUCOSE: 104 mg/dL — AB (ref 65–99)
Potassium: 5.2 mmol/L — ABNORMAL HIGH (ref 3.5–5.1)
SODIUM: 138 mmol/L (ref 135–145)
Total Bilirubin: 0.9 mg/dL (ref 0.3–1.2)
Total Protein: 6.8 g/dL (ref 6.5–8.1)

## 2015-06-09 LAB — CBC
HCT: 42.2 % (ref 35.0–47.0)
Hemoglobin: 14.3 g/dL (ref 12.0–16.0)
MCH: 30.1 pg (ref 26.0–34.0)
MCHC: 33.9 g/dL (ref 32.0–36.0)
MCV: 88.8 fL (ref 80.0–100.0)
PLATELETS: 310 10*3/uL (ref 150–440)
RBC: 4.74 MIL/uL (ref 3.80–5.20)
RDW: 14.8 % — ABNORMAL HIGH (ref 11.5–14.5)
WBC: 7.1 10*3/uL (ref 3.6–11.0)

## 2015-06-09 LAB — URINALYSIS COMPLETE WITH MICROSCOPIC (ARMC ONLY)
BILIRUBIN URINE: NEGATIVE
Glucose, UA: NEGATIVE mg/dL
Hgb urine dipstick: NEGATIVE
KETONES UR: NEGATIVE mg/dL
Leukocytes, UA: NEGATIVE
Nitrite: NEGATIVE
PROTEIN: NEGATIVE mg/dL
Specific Gravity, Urine: 1.017 (ref 1.005–1.030)
pH: 5 (ref 5.0–8.0)

## 2015-06-09 LAB — LIPASE, BLOOD: Lipase: 29 U/L (ref 22–51)

## 2015-06-09 MED ORDER — TRAMADOL HCL 50 MG PO TABS: 50.0000 mg | ORAL_TABLET | Freq: Four times a day (QID) | ORAL | Status: DC | PRN

## 2015-06-09 MED ORDER — TRAMADOL HCL 50 MG PO TABS
100.0000 mg | ORAL_TABLET | Freq: Once | ORAL | Status: AC
  Administered 2015-06-09: 100 mg via ORAL
  Filled 2015-06-09: qty 2

## 2015-06-09 MED ORDER — SODIUM CHLORIDE 0.9 % IV BOLUS (SEPSIS)
1000.0000 mL | Freq: Once | INTRAVENOUS | Status: AC
  Administered 2015-06-09: 1000 mL via INTRAVENOUS

## 2015-06-09 MED ORDER — HYDROMORPHONE HCL 1 MG/ML IJ SOLN
0.5000 mg | INTRAMUSCULAR | Status: AC | PRN
  Administered 2015-06-09 (×2): 0.5 mg via INTRAVENOUS
  Filled 2015-06-09 (×2): qty 1

## 2015-06-09 NOTE — ED Provider Notes (Signed)
[facility] Emergency Department Provider Note  ____________________________________________  Time seen: 2010  I have reviewed the triage vital signs and the nursing notes.   HISTORY  Chief Complaint Flank Pain  right side    HPI Sabrina Fuentes is a 44 y.o. female who reports she began having right-sided flank pain earlier today. It has been waxing and waning, almost crampy at times. She points towards her right lower quadrant as the source and shows that it is reading back up towards her back on the right. She denies having symptoms like this before. She reports kidney stones run in her family but she has never had one. She's not had any nausea, vomiting, or diarrhea. She is status post bilateral tubal ligation in March of this year.   History reviewed. No pertinent past medical history.  There are no active problems to display for this patient.   History reviewed. No pertinent past surgical history.  Current Outpatient Rx  Name  Route  Sig  Dispense  Refill  . traMADol (ULTRAM) 50 MG tablet   Oral   Take 1 tablet (50 mg total) by mouth every 6 (six) hours as needed.   10 tablet   0     Allergies Penicillins  No family history on file.  Social History Social History  Substance Use Topics  . Smoking status: Current Every Day Smoker  . Smokeless tobacco: None  . Alcohol Use: No    Review of Systems  Constitutional: Negative for fever. ENT: Patient reports she had some sinus congestion yesterday. Cardiovascular: Negative for chest pain. Respiratory: Negative for cough. Gastrointestinal: Negative for vomiting and diarrhea. Positive for right-sided flank pain. Genitourinary: Negative for dysuria. Musculoskeletal: Patient had discomfort in her chest with movement of her right arm yesterday. She currently complains of some tenderness in the left upper back, scapular area.. Skin: Negative for rash. Neurological: Negative for paresthesia or  weakness   10-point ROS otherwise negative.  ____________________________________________   PHYSICAL EXAM:  VITAL SIGNS: ED Triage Vitals  Enc Vitals Group     BP 06/09/15 1941 128/76 mmHg     Pulse Rate 06/09/15 1941 94     Resp 06/09/15 1941 16     Temp 06/09/15 1941 98.6 F (37 C)     Temp Source 06/09/15 1941 Oral     SpO2 06/09/15 1941 97 %     Weight 06/09/15 1941 275 lb (124.739 kg)     Height 06/09/15 1941 5\' 4"  (1.626 m)     Head Cir --      Peak Flow --      Pain Score 06/09/15 1939 7     Pain Loc --      Pain Edu? --      Excl. in [place]? --     Constitutional: Alert and oriented. Patient appears a little uncomfortable but she is in no distress. ENT   Head: Normocephalic and atraumatic.   Nose: No congestion/rhinnorhea.    Cardiovascular: Normal rate, regular rhythm, no murmur noted Respiratory:  Normal respiratory effort, no tachypnea.    Breath sounds are clear and equal bilaterally.  Gastrointestinal: Soft.  Mild tenderness in the right lower quadrant without peritoneal signs. No Rovsing's, no rebound tenderness. No distention.  Back: No muscle spasm, no tenderness, no CVA tenderness. Musculoskeletal: No deformity noted. Nontender with normal range of motion in all extremities.  No noted edema. Neurologic:  Normal speech and language. No gross focal neurologic deficits are appreciated.  Skin:  Skin is warm, dry. No rash noted. Psychiatric: Mood and affect are normal. Speech and behavior are normal.  ____________________________________________    LABS (pertinent positives/negatives)  Labs Reviewed  COMPREHENSIVE METABOLIC PANEL - Abnormal; Notable for the following:    Potassium 5.2 (*)    Glucose, Bld 104 (*)    All other components within normal limits  CBC - Abnormal; Notable for the following:    RDW 14.8 (*)    All other components within normal limits  URINALYSIS COMPLETEWITH MICROSCOPIC ([facility] ONLY) - Abnormal; Notable for the following:     Color, Urine YELLOW (*)    APPearance HAZY (*)    Bacteria, UA RARE (*)    Squamous Epithelial / LPF 6-30 (*)    All other components within normal limits  LIPASE, BLOOD  POC URINE PREG, ED     ____________________________________________  RADIOLOGY  CT abdomen and pelvis, renal stone protocol:  IMPRESSION: 1. Mild degradation secondary to patient body habitus. Degradation primarily involves the pelvis. 2. Bilateral nephrolithiasis, without obstructive uropathy. 3. Hiatal hernia. 4. Hepatomegaly and hepatic steatosis. 5. Possible constipation. 6. Uterine fibroid.   ____________________________________________  INITIAL IMPRESSION / ASSESSMENT AND PLAN / ED COURSE  Pertinent labs & imaging results that were available during my care of the patient were reviewed by me and considered in my medical decision making (see chart for details).  44 year old female with right sided abdominal pain/flank pain. Clinically this is not appear to be appendicitis. She has no peritoneal signs, no rebound tenderness. The way she discusses the pain wrapping around her flank and the intermittent spasms makes me more concerned for a possible renal stone. I discussed the options of getting a CT scan with the patient and she agrees to this evaluation. Urinalysis and labs are pending. We will treat her pain with a small dose of Dilaudid.  ----------------------------------------- 9:27 PM on 06/09/2015 -----------------------------------------  CT scan does not show any acute explanation for the patient's pain. She has bilateral nephrolithiasis but no instructions. She has a normal appearing appendix on CT. She does have a uterine fibroid. The gallbladder appears normal on CT and there is no ductal dilatation.   White blood cell count is normal at 7.1K. There is slight hemolysis, which may explain her slight elevation in potassium at 5.2. The remainder of her metabolic panel is unremarkable. Her  urinalysis is negative for hematuria.   On reexamination the patient feels more comfortable. She is having no acute pain. She does report that when she sneezes it hurts more on the right side. Initially we had spoken about the possibility of this being musculoskeletal pain, although her tenderness did not appear to be of that manner. Now, with a negative CT and pain with sneezing, on more inclined to think about the case. She continues to complain of some nasal congestion and URI symptoms. We have discussed conservative treatment for this. I will prescribe some tramadol for her right abdominal pain.    ____________________________________________   FINAL CLINICAL IMPRESSION(S) / ED DIAGNOSES  Final diagnoses:  Right sided abdominal pain  URI, acute       Ahmed Prima, MD 06/09/15 2143

## 2015-06-09 NOTE — ED Notes (Signed)
Pt reports right flank pain onset a few hours ago. No difficulty urinating. No nausea/vomiting.

## 2015-06-09 NOTE — ED Notes (Signed)
Urine Preg POC negative

## 2015-06-09 NOTE — Discharge Instructions (Signed)
Your blood tests, urine, and CT scan I think a overall. Your appendix appeared normal. You do not have any obstructing kidney stones. We discussed the possibility that this pain is musculoskeletal and due to possible strain from sneezing or coughing. Take Tylenol or ibuprofen as needed for discomfort. If he needs additional medication, you may take tramadol as prescribed. If the pain worsens, if you have fever, or if you have other urgent concerns, return to the emergency department.   Abdominal Pain, Adult Many things can cause abdominal pain. Usually, abdominal pain is not caused by a disease and will improve without treatment. It can often be observed and treated at home. Your health care provider will do a physical exam and possibly order blood tests and X-rays to help determine the seriousness of your pain. However, in many cases, more time must pass before a clear cause of the pain can be found. Before that point, your health care provider may not know if you need more testing or further treatment. HOME CARE INSTRUCTIONS Monitor your abdominal pain for any changes. The following actions may help to alleviate any discomfort you are experiencing:  Only take over-the-counter or prescription medicines as directed by your health care provider.  Do not take laxatives unless directed to do so by your health care provider.  Try a clear liquid diet (broth, tea, or water) as directed by your health care provider. Slowly move to a bland diet as tolerated. SEEK MEDICAL CARE IF:  You have unexplained abdominal pain.  You have abdominal pain associated with nausea or diarrhea.  You have pain when you urinate or have a bowel movement.  You experience abdominal pain that wakes you in the night.  You have abdominal pain that is worsened or improved by eating food.  You have abdominal pain that is worsened with eating fatty foods.  You have a fever. SEEK IMMEDIATE MEDICAL CARE IF:  Your pain does  not go away within 2 hours.  You keep throwing up (vomiting).  Your pain is felt only in portions of the abdomen, such as the right side or the left lower portion of the abdomen.  You pass bloody or black tarry stools. MAKE SURE YOU:  Understand these instructions.  Will watch your condition.  Will get help right away if you are not doing well or get worse.   This information is not intended to replace advice given to you by your health care provider. Make sure you discuss any questions you have with your health care provider.   Document Released: 05/22/2005 Document Revised: 05/03/2015 Document Reviewed: 04/21/2013 Elsevier Interactive Patient Education [employer].

## 2015-08-27 ENCOUNTER — Emergency Department
Admission: EM | Admit: 2015-08-27 | Discharge: 2015-08-27 | Disposition: A | Discharge status: Home / Self Care | Payer: Self-pay | Attending: Emergency Medicine | Admitting: Emergency Medicine

## 2015-08-27 ENCOUNTER — Emergency Department: Payer: Self-pay

## 2015-08-27 ENCOUNTER — Encounter: Payer: Self-pay | Admitting: Emergency Medicine

## 2015-08-27 DIAGNOSIS — R110 Nausea: Secondary | ICD-10-CM | POA: Insufficient documentation

## 2015-08-27 DIAGNOSIS — Z3202 Encounter for pregnancy test, result negative: Secondary | ICD-10-CM | POA: Insufficient documentation

## 2015-08-27 DIAGNOSIS — Z880 Allergy status to penicillin: Secondary | ICD-10-CM | POA: Insufficient documentation

## 2015-08-27 DIAGNOSIS — D259 Leiomyoma of uterus, unspecified: Secondary | ICD-10-CM | POA: Insufficient documentation

## 2015-08-27 DIAGNOSIS — F17210 Nicotine dependence, cigarettes, uncomplicated: Secondary | ICD-10-CM | POA: Insufficient documentation

## 2015-08-27 DIAGNOSIS — R1031 Right lower quadrant pain: Secondary | ICD-10-CM

## 2015-08-27 DIAGNOSIS — R109 Unspecified abdominal pain: Secondary | ICD-10-CM

## 2015-08-27 HISTORY — DX: Unspecified abdominal hernia without obstruction or gangrene: K46.9

## 2015-08-27 LAB — CBC
HEMATOCRIT: 41.7 % (ref 35.0–47.0)
HEMOGLOBIN: 14.4 g/dL (ref 12.0–16.0)
MCH: 30.1 pg (ref 26.0–34.0)
MCHC: 34.5 g/dL (ref 32.0–36.0)
MCV: 87.3 fL (ref 80.0–100.0)
Platelets: 319 10*3/uL (ref 150–440)
RBC: 4.77 MIL/uL (ref 3.80–5.20)
RDW: 14.4 % (ref 11.5–14.5)
WBC: 14.6 10*3/uL — AB (ref 3.6–11.0)

## 2015-08-27 LAB — LIPASE, BLOOD: LIPASE: 23 U/L (ref 11–51)

## 2015-08-27 LAB — COMPREHENSIVE METABOLIC PANEL
ALBUMIN: 4 g/dL (ref 3.5–5.0)
ALT: 18 U/L (ref 14–54)
ANION GAP: 7 (ref 5–15)
AST: 18 U/L (ref 15–41)
Alkaline Phosphatase: 68 U/L (ref 38–126)
BILIRUBIN TOTAL: 0.8 mg/dL (ref 0.3–1.2)
BUN: 17 mg/dL (ref 6–20)
CHLORIDE: 106 mmol/L (ref 101–111)
CO2: 23 mmol/L (ref 22–32)
Calcium: 8.7 mg/dL — ABNORMAL LOW (ref 8.9–10.3)
Creatinine, Ser: 0.72 mg/dL (ref 0.44–1.00)
GFR calc Af Amer: >60 mL/min (ref >60)
Glucose, Bld: 114 mg/dL — ABNORMAL HIGH (ref 65–99)
POTASSIUM: 4.9 mmol/L (ref 3.5–5.1)
Sodium: 136 mmol/L (ref 135–145)
TOTAL PROTEIN: 7 g/dL (ref 6.5–8.1)

## 2015-08-27 LAB — URINALYSIS COMPLETE WITH MICROSCOPIC (ARMC ONLY)
BACTERIA UA: NONE SEEN
Bilirubin Urine: NEGATIVE
Glucose, UA: NEGATIVE mg/dL
Ketones, ur: NEGATIVE mg/dL
LEUKOCYTES UA: NEGATIVE
NITRITE: NEGATIVE
PH: 6 (ref 5.0–8.0)
PROTEIN: NEGATIVE mg/dL
SPECIFIC GRAVITY, URINE: 1.017 (ref 1.005–1.030)
SQUAMOUS EPITHELIAL / LPF: NONE SEEN
WBC UA: NONE SEEN WBC/hpf (ref 0–5)

## 2015-08-27 LAB — POCT PREGNANCY, URINE: PREG TEST UR: NEGATIVE

## 2015-08-27 MED ORDER — OXYCODONE-ACETAMINOPHEN 5-325 MG PO TABS: 1.0000 | ORAL_TABLET | ORAL | Status: DC | PRN

## 2015-08-27 MED ORDER — ONDANSETRON HCL 4 MG/2ML IJ SOLN
4.0000 mg | Freq: Once | INTRAMUSCULAR | Status: AC
  Administered 2015-08-27: 4 mg via INTRAVENOUS
  Filled 2015-08-27: qty 2

## 2015-08-27 MED ORDER — SODIUM CHLORIDE 0.9 % IV BOLUS (SEPSIS)
1000.0000 mL | Freq: Once | INTRAVENOUS | Status: AC
  Administered 2015-08-27: 1000 mL via INTRAVENOUS

## 2015-08-27 MED ORDER — MORPHINE SULFATE (PF) 2 MG/ML IV SOLN
2.0000 mg | Freq: Once | INTRAVENOUS | Status: AC
  Administered 2015-08-27: 2 mg via INTRAVENOUS
  Filled 2015-08-27: qty 1

## 2015-08-27 NOTE — ED Notes (Signed)
Patient transported from CT to room 16, await results and dispo.

## 2015-08-27 NOTE — Discharge Instructions (Signed)
Abdominal Pain, Adult Many things can cause abdominal pain. Usually, abdominal pain is not caused by a disease and will improve without treatment. It can often be observed and treated at home. Your health care provider will do a physical exam and possibly order blood tests and X-rays to help determine the seriousness of your pain. However, in many cases, more time must pass before a clear cause of the pain can be found. Before that point, your health care provider may not know if you need more testing or further treatment. HOME CARE INSTRUCTIONS Monitor your abdominal pain for any changes. The following actions may help to alleviate any discomfort you are experiencing:  Only take over-the-counter or prescription medicines as directed by your health care provider.  Do not take laxatives unless directed to do so by your health care provider.  Try a clear liquid diet (broth, tea, or water) as directed by your health care provider. Slowly move to a bland diet as tolerated. SEEK MEDICAL CARE IF:  You have unexplained abdominal pain.  You have abdominal pain associated with nausea or diarrhea.  You have pain when you urinate or have a bowel movement.  You experience abdominal pain that wakes you in the night.  You have abdominal pain that is worsened or improved by eating food.  You have abdominal pain that is worsened with eating fatty foods.  You have a fever. SEEK IMMEDIATE MEDICAL CARE IF:  Your pain does not go away within 2 hours.  You keep throwing up (vomiting).  Your pain is felt only in portions of the abdomen, such as the right side or the left lower portion of the abdomen.  You pass bloody or black tarry stools. MAKE SURE YOU:  Understand these instructions.  Will watch your condition.  Will get help right away if you are not doing well or get worse.   This information is not intended to replace advice given to you by your health care provider. Make sure you discuss  any questions you have with your health care provider.   Document Released: 05/22/2005 Document Revised: 05/03/2015 Document Reviewed: 04/21/2013 Elsevier Interactive Patient Education 2016 [employer]  Uterine Fibroids Uterine fibroids are tissue masses (tumors) that can develop in the womb (uterus). They are also called leiomyomas. This type of tumor is not cancerous (benign) and does not spread to other parts of the body outside of the pelvic area, which is between the hip bones. Occasionally, fibroids may develop in the fallopian tubes, in the cervix, or on the support structures (ligaments) that surround the uterus. You can have one or many fibroids. Fibroids can vary in size, weight, and where they grow in the uterus. Some can become quite large. Most fibroids do not require medical treatment. CAUSES A fibroid can develop when a single uterine cell keeps growing (replicating). Most cells in the human body have a control mechanism that keeps them from replicating without control. SIGNS AND SYMPTOMS Symptoms may include:   Heavy bleeding during your period.  Bleeding or spotting between periods.  Pelvic pain and pressure.  Bladder problems, such as needing to urinate more often (urinary frequency) or urgently.  Inability to reproduce offspring (infertility).  Miscarriages. DIAGNOSIS Uterine fibroids are diagnosed through a physical exam. Your health care provider may feel the lumpy tumors during a pelvic exam. Ultrasonography and an MRI may be done to determine the size, location, and number of fibroids. TREATMENT Treatment may include:  Watchful waiting. This involves getting the fibroid  checked by your health care provider to see if it grows or shrinks. Follow your health care provider's recommendations for how often to have this checked.  Hormone medicines. These can be taken by mouth or given through an intrauterine device (IUD).  Surgery.  Removing the fibroids  (myomectomy) or the uterus (hysterectomy).  Removing blood supply to the fibroids (uterine artery embolization). If fibroids interfere with your fertility and you want to become pregnant, your health care provider may recommend having the fibroids removed.  HOME CARE INSTRUCTIONS  Keep all follow-up visits as directed by your health care provider. This is important.  Take medicines only as directed by your health care provider.  If you were prescribed a hormone treatment, take the hormone medicines exactly as directed.  Do not take aspirin, because it can cause bleeding.  Ask your health care provider about taking iron pills and increasing the amount of dark green, leafy vegetables in your diet. These actions can help to boost your blood iron levels, which may be affected by heavy menstrual bleeding.  Pay close attention to your period and tell your health care provider about any changes, such as:  Increased blood flow that requires you to use more pads or tampons than usual per month.  A change in the number of days that your period lasts per month.  A change in symptoms that are associated with your period, such as abdominal cramping or back pain. SEEK MEDICAL CARE IF:  You have pelvic pain, back pain, or abdominal cramps that cannot be controlled with medicines.  You have an increase in bleeding between and during periods.  You soak tampons or pads in a half hour or less.  You feel lightheaded, extra tired, or weak. SEEK IMMEDIATE MEDICAL CARE IF:  You faint.  You have a sudden increase in pelvic pain.   This information is not intended to replace advice given to you by your health care provider. Make sure you discuss any questions you have with your health care provider.   Document Released: 08/09/2000 Document Revised: 09/02/2014 Document Reviewed: 02/08/2014 Elsevier Interactive Patient Education [employer].

## 2015-08-27 NOTE — ED Notes (Signed)
Patient transported to CT 

## 2015-08-27 NOTE — ED Provider Notes (Signed)
[facility] Emergency Department Provider Note  ____________________________________________  Time seen: 5:00 AM  I have reviewed the triage vital signs and the nursing notes.   HISTORY  Chief Complaint Abdominal Pain    HPI Sabrina Fuentes is a 45 y.o. female presents with periumbilical abdominal pain starting yesterday which has since migrated to the right lower quadrant. Patient states current pain score is 8 out of 10. Patient denies any nausea vomiting or diarrhea. Patient denies any fever or constipation. Patient denies any urinary symptoms.     Past Medical History  Diagnosis Date  . Abdominal hernia     There are no active problems to display for this patient.   Past Surgical History  Procedure Laterality Date  . Tubal ligation      Current Outpatient Rx  Name  Route  Sig  Dispense  Refill  . traMADol (ULTRAM) 50 MG tablet   Oral   Take 1 tablet (50 mg total) by mouth every 6 (six) hours as needed.   10 tablet   0     Allergies Iodinated diagnostic agents and Penicillins  History reviewed. No pertinent family history.  Social History Social History  Substance Use Topics  . Smoking status: Current Every Day Smoker    Types: Cigarettes  . Smokeless tobacco: None  . Alcohol Use: No    Review of Systems  Constitutional: Negative for fever. Eyes: Negative for visual changes. ENT: Negative for sore throat. Cardiovascular: Negative for chest pain. Respiratory: Negative for shortness of breath. Gastrointestinal: Positive for abdominal pain and nausea Genitourinary: Negative for dysuria. Musculoskeletal: Negative for back pain. Skin: Negative for rash. Neurological: Negative for headaches, focal weakness or numbness.   10-point ROS otherwise negative.  ____________________________________________   PHYSICAL EXAM:  VITAL SIGNS: ED Triage Vitals  Enc Vitals Group     BP 08/27/15 0422 150/75 mmHg     Pulse Rate  08/27/15 0422 104     Resp 08/27/15 0422 20     Temp 08/27/15 0422 97.8 F (36.6 C)     Temp Source 08/27/15 0422 Oral     SpO2 08/27/15 0422 96 %     Weight 08/27/15 0422 275 lb (124.739 kg)     Height 08/27/15 0422 5\' 4"  (1.626 m)     Head Cir --      Peak Flow --      Pain Score 08/27/15 0422 7     Pain Loc --      Pain Edu? --      Excl. in [place]? --      Constitutional: Alert and oriented. Apparent discomfort Eyes: Conjunctivae are normal. PERRL. Normal extraocular movements. ENT   Head: Normocephalic and atraumatic.   Nose: No congestion/rhinnorhea.   Mouth/Throat: Mucous membranes are moist.   Neck: No stridor. Hematological/Lymphatic/Immunilogical: No cervical lymphadenopathy. Cardiovascular: Normal rate, regular rhythm. Normal and symmetric distal pulses are present in all extremities. No murmurs, rubs, or gallops. Respiratory: Normal respiratory effort without tachypnea nor retractions. Breath sounds are clear and equal bilaterally. No wheezes/rales/rhonchi. Gastrointestinal: Right lower quadrant tenderness to palpation. No distention. There is no CVA tenderness. Genitourinary: deferred Musculoskeletal: Nontender with normal range of motion in all extremities. No joint effusions.  No lower extremity tenderness nor edema. Neurologic:  Normal speech and language. No gross focal neurologic deficits are appreciated. Speech is normal.  Skin:  Skin is warm, dry and intact. No rash noted. Psychiatric: Mood and affect are normal. Speech and behavior are normal.  Patient exhibits appropriate insight and judgment.  ____________________________________________    LABS (pertinent positives/negatives)  Labs Reviewed  COMPREHENSIVE METABOLIC PANEL - Abnormal; Notable for the following:    Glucose, Bld 114 (*)    Calcium 8.7 (*)    All other components within normal limits  CBC - Abnormal; Notable for the following:    WBC 14.6 (*)    All other components within  normal limits  URINALYSIS COMPLETEWITH MICROSCOPIC ([facility] ONLY) - Abnormal; Notable for the following:    Color, Urine YELLOW (*)    APPearance CLEAR (*)    Hgb urine dipstick 1+ (*)    All other components within normal limits  LIPASE, BLOOD  POC URINE PREG, ED  POCT PREGNANCY, URINE         RADIOLOGY  CT Abdomen Pelvis Wo Contrast (Final result) Result time: 08/27/15 08:13:25   Procedure changed from CT Abdomen Pelvis W Contrast      Final result by Rad Results In Interface (08/27/15 08:13:25)   Narrative:   CLINICAL DATA: Pt c/o abdominal pain that started yesterday below umbilicus but has moved out towards right hip area; denies N/V/D; denies fever. Hx of tubal surgery. No hx of cancer.  EXAM: CT ABDOMEN AND PELVIS WITHOUT CONTRAST  TECHNIQUE: Multidetector CT imaging of the abdomen and pelvis was performed following the standard protocol without IV contrast.  COMPARISON: 06/09/2015  FINDINGS: Lower chest: Minimal subsegmental atelectasis noted at both lung bases. Moderate hiatal hernia present. Heart size is normal. No imaged pericardial effusion or significant coronary artery calcifications.  Upper abdomen: The liver is enlarged and low-attenuation, consistent with hepatic steatosis. No focal abnormality identified within the liver, spleen, pancreas, or adrenal glands. There small intrarenal calculi within the kidneys bilaterally, measuring on the order of 1-2 mm in size. No hydronephrosis or ureteral stones. Gallbladder is present.  Gastrointestinal tract: Hiatal hernia. Distal stomach is normal in appearance. Small bowel loops are normal in appearance. The appendix is well seen and has a normal appearance. Colonic loops are normal in appearance.  Pelvis: Uterus is enlarged and contains calcified anterior fibroid. No adnexal mass. No free pelvic fluid.  Retroperitoneum: No evidence for aortic aneurysm. No retroperitoneal or mesenteric  adenopathy.  Abdominal wall: Fat containing paraumbilical hernia not associated with bowel loops or obstruction. Stable appearance.  Osseous structures: Negative  IMPRESSION: 1. Nonobstructing bilateral intrarenal calculi. 2. Normal appearance of the appendix. 3. Uncomplicated infraumbilical fat containing hernia. 4. Enlarged uterus with calcified anterior fibroid. 5. Stable enlargement of the liver, associated with hepatic steatosis.   Electronically Signed By: Nolon Nations M.D. On: 08/27/2015 08:13           INITIAL IMPRESSION / ASSESSMENT AND PLAN / ED COURSE  Pertinent labs & imaging results that were available during my care of the patient were reviewed by me and considered in my medical decision making (see chart for details).    ____________________________________________   FINAL CLINICAL IMPRESSION(S) / ED DIAGNOSES  Final diagnoses:  Right lower quadrant abdominal pain  Uterine leiomyoma, unspecified location      Gregor Hams, MD 08/27/15 [id number]

## 2015-08-27 NOTE — ED Notes (Signed)
Pt c/o abdominal pain that started yesterday below umbilicus but has moved out towards right hip area; denies N/V/D; denies fever; denies urinary s/s

## 2015-08-27 NOTE — ED Notes (Signed)
Discussed discharge instructions, prescriptions, and follow-up care with patient. No questions or concerns at this time. Pt stable at discharge.  

## 2015-08-31 ENCOUNTER — Emergency Department: Payer: Self-pay

## 2015-08-31 ENCOUNTER — Emergency Department
Admission: EM | Admit: 2015-08-31 | Discharge: 2015-08-31 | Disposition: A | Discharge status: Home / Self Care | Payer: Self-pay | Attending: Emergency Medicine | Admitting: Emergency Medicine

## 2015-08-31 ENCOUNTER — Encounter: Payer: Self-pay | Admitting: *Deleted

## 2015-08-31 DIAGNOSIS — Z880 Allergy status to penicillin: Secondary | ICD-10-CM | POA: Insufficient documentation

## 2015-08-31 DIAGNOSIS — R0602 Shortness of breath: Secondary | ICD-10-CM | POA: Insufficient documentation

## 2015-08-31 DIAGNOSIS — M546 Pain in thoracic spine: Secondary | ICD-10-CM | POA: Insufficient documentation

## 2015-08-31 DIAGNOSIS — R079 Chest pain, unspecified: Secondary | ICD-10-CM | POA: Insufficient documentation

## 2015-08-31 DIAGNOSIS — F17210 Nicotine dependence, cigarettes, uncomplicated: Secondary | ICD-10-CM | POA: Insufficient documentation

## 2015-08-31 LAB — BASIC METABOLIC PANEL
ANION GAP: 10 (ref 5–15)
BUN: 13 mg/dL (ref 6–20)
CALCIUM: 9.3 mg/dL (ref 8.9–10.3)
CO2: 18 mmol/L — ABNORMAL LOW (ref 22–32)
Chloride: 108 mmol/L (ref 101–111)
Creatinine, Ser: 0.68 mg/dL (ref 0.44–1.00)
Glucose, Bld: 117 mg/dL — ABNORMAL HIGH (ref 65–99)
POTASSIUM: 4.6 mmol/L (ref 3.5–5.1)
Sodium: 136 mmol/L (ref 135–145)

## 2015-08-31 LAB — CBC
HEMATOCRIT: 47.1 % — AB (ref 35.0–47.0)
HEMOGLOBIN: 15.7 g/dL (ref 12.0–16.0)
MCH: 29.6 pg (ref 26.0–34.0)
MCHC: 33.3 g/dL (ref 32.0–36.0)
MCV: 88.9 fL (ref 80.0–100.0)
Platelets: 329 10*3/uL (ref 150–440)
RBC: 5.3 MIL/uL — AB (ref 3.80–5.20)
RDW: 14.2 % (ref 11.5–14.5)
WBC: 8 10*3/uL (ref 3.6–11.0)

## 2015-08-31 LAB — TROPONIN I

## 2015-08-31 MED ORDER — METHYLPREDNISOLONE SODIUM SUCC 125 MG IJ SOLR
125.0000 mg | Freq: Once | INTRAMUSCULAR | Status: AC
  Administered 2015-08-31: 125 mg via INTRAVENOUS
  Filled 2015-08-31 (×2): qty 2

## 2015-08-31 MED ORDER — TRAMADOL HCL 50 MG PO TABS: 50.0000 mg | ORAL_TABLET | Freq: Four times a day (QID) | ORAL | Status: DC | PRN

## 2015-08-31 MED ORDER — DIPHENHYDRAMINE HCL 25 MG PO CAPS
50.0000 mg | ORAL_CAPSULE | Freq: Once | ORAL | Status: AC
  Administered 2015-08-31: 50 mg via ORAL
  Filled 2015-08-31: qty 2

## 2015-08-31 MED ORDER — IOHEXOL 350 MG/ML SOLN
75.0000 mL | Freq: Once | INTRAVENOUS | Status: AC | PRN
  Administered 2015-08-31: 75 mL via INTRAVENOUS
  Filled 2015-08-31: qty 75

## 2015-08-31 NOTE — Discharge Instructions (Signed)

## 2015-08-31 NOTE — ED Provider Notes (Signed)
[facility] Emergency Department Provider Note  Time seen: 3:52 PM  I have reviewed the triage vital signs and the nursing notes.   HISTORY  Chief Complaint Chest Pain    HPI Sabrina Fuentes is a 45 y.o. female with a past medical history of abdominal hernia, prior pulmonary embolus in 2006, who presents the emergency department with left posterior chest pain. According to the patient for the past 2-3 weeks she has had cough and congestion. She saw her primary care doctor today due to 1-2 weeks of left posterior chest pain worse with deep inspiration, worse when lying flat. States she feels short of breath occasionally with exertion.Has a history of a pulmonary embolus in 2006 which was thought to be due to birth control. The patient is now off birth control, does not take any blood thinners. Describes her pain as moderate, worse with inspiration or cough.     Past Medical History  Diagnosis Date  . Abdominal hernia     There are no active problems to display for this patient.   Past Surgical History  Procedure Laterality Date  . Tubal ligation      Current Outpatient Rx  Name  Route  Sig  Dispense  Refill  . oxyCODONE-acetaminophen (ROXICET) 5-325 MG tablet   Oral   Take 1 tablet by mouth every 4 (four) hours as needed for severe pain.   20 tablet   0   . traMADol (ULTRAM) 50 MG tablet   Oral   Take 1 tablet (50 mg total) by mouth every 6 (six) hours as needed.   10 tablet   0     Allergies Iodinated diagnostic agents and Penicillins  No family history on file.  Social History Social History  Substance Use Topics  . Smoking status: Current Every Day Smoker    Types: Cigarettes  . Smokeless tobacco: None  . Alcohol Use: No    Review of Systems Constitutional: Negative for fever. Cardiovascular: Positive left-sided posterior chest pain. Respiratory: Occasional shortness of breath with exertion. Gastrointestinal: Negative for  abdominal pain, vomiting and diarrhea. Musculoskeletal: Positive left posterior chest pain. Neurological: Negative for headache 10-point ROS otherwise negative.  ____________________________________________   PHYSICAL EXAM:  VITAL SIGNS: ED Triage Vitals  Enc Vitals Group     BP 08/31/15 1349 102/67 mmHg     Pulse Rate 08/31/15 1349 92     Resp 08/31/15 1349 18     Temp 08/31/15 1349 98 F (36.7 C)     Temp Source 08/31/15 1349 Oral     SpO2 08/31/15 1349 96 %     Weight 08/31/15 1349 272 lb (123.378 kg)     Height 08/31/15 1349 5\' 4"  (1.626 m)     Head Cir --      Peak Flow --      Pain Score --      Pain Loc --      Pain Edu? --      Excl. in [place]? --     Constitutional: Alert and oriented. Well appearing and in no distress. Eyes: Normal exam ENT   Head: Normocephalic and atraumatic.   Nose: No congestion/rhinnorhea.   Mouth/Throat: Mucous membranes are moist. Cardiovascular: Normal rate, regular rhythm. No murmur Respiratory: Normal respiratory effort without tachypnea nor retractions. Breath sounds are clear and equal bilaterally. No wheezes/rales/rhonchi. Nontender chest palpation. Gastrointestinal: Soft and nontender. No distention. Musculoskeletal: Nontender with normal range of motion in all extremities. No lower extremity tenderness  or edema. Neurologic:  Normal speech and language. No gross focal neurologic deficits Skin:  Skin is warm, dry and intact.  Psychiatric: Mood and affect are normal. Speech and behavior are normal.  ____________________________________________    EKG  EKG reviewed and interpreted by myself shows normal sinus rhythm at 81 bpm, narrow QS, normal axis, normal interval, no ST changes. Overall normal EKG.  ____________________________________________    RADIOLOGY  Chest x-ray within normal limits  ____________________________________________   INITIAL IMPRESSION / ASSESSMENT AND PLAN / ED COURSE  Pertinent labs &  imaging results that were available during my care of the patient were reviewed by me and considered in my medical decision making (see chart for details).  Patient presents for left posterior chest pain, worse with deep inspiration. Patient has a history of blood clot in the past. She saw her primary care physician today who referred her to the emergency department to rule out pulmonary embolus. Patient's labs are within normal limits, vitals are within normal limits, chest x-ray and EKG are normal. However given the patient's history of pulmonary emboli in the past with pleuritic chest pain which is nonreproducible on exam we will proceed with a CT angiogram of the chest to rule out PE. Patient states during a past CT scan with contrast she had mild itching after the CT and was given Benadryl. Denies any hives, swelling or trouble breathing. We will treat with Solu-Medrol and Benadryl prior to CT scan.  CT negative for PE. We'll place the patient on a small amount of Ultram for pain control and have her follow-up with a primary care physician 1-2 days for recheck.  ____________________________________________   FINAL CLINICAL IMPRESSION(S) / ED DIAGNOSES  Chest pain   Harvest Dark, MD 08/31/15 1932

## 2015-08-31 NOTE — ED Notes (Signed)
Pt reports chest pain radiating to back for 2 weeks, pt sent by MD

## 2016-01-20 ENCOUNTER — Emergency Department
Admission: EM | Admit: 2016-01-20 | Discharge: 2016-01-20 | Disposition: A | Discharge status: Home / Self Care | Payer: Self-pay | Attending: Emergency Medicine | Admitting: Emergency Medicine

## 2016-01-20 ENCOUNTER — Encounter: Payer: Self-pay | Admitting: Emergency Medicine

## 2016-01-20 DIAGNOSIS — F17210 Nicotine dependence, cigarettes, uncomplicated: Secondary | ICD-10-CM | POA: Insufficient documentation

## 2016-01-20 DIAGNOSIS — L0231 Cutaneous abscess of buttock: Secondary | ICD-10-CM | POA: Insufficient documentation

## 2016-01-20 MED ORDER — LIDOCAINE-EPINEPHRINE (PF) 1 %-1:200000 IJ SOLN
INTRAMUSCULAR | Status: AC
  Filled 2016-01-20: qty 30

## 2016-01-20 MED ORDER — SULFAMETHOXAZOLE-TRIMETHOPRIM 800-160 MG PO TABS: 1.0000 | ORAL_TABLET | Freq: Two times a day (BID) | ORAL | Status: DC

## 2016-01-20 MED ORDER — LIDOCAINE-EPINEPHRINE (PF) 1 %-1:200000 IJ SOLN: 30.0000 mL | Freq: Once | INTRAMUSCULAR | Status: DC

## 2016-01-20 MED ORDER — HYDROCODONE-ACETAMINOPHEN 5-325 MG PO TABS: 1.0000 | ORAL_TABLET | ORAL | Status: DC | PRN

## 2016-01-20 NOTE — Discharge Instructions (Signed)
Incision and Drainage Incision and drainage is a procedure in which a sac-like structure (cystic structure) is opened and drained. The area to be drained usually contains material such as pus, fluid, or blood.  LET YOUR CAREGIVER KNOW ABOUT:   Allergies to medicine.  Medicines taken, including vitamins, herbs, eyedrops, over-the-counter medicines, and creams.  Use of steroids (by mouth or creams).  Previous problems with anesthetics or numbing medicines.  History of bleeding problems or blood clots.  Previous surgery.  Other health problems, including diabetes and kidney problems.  Possibility of pregnancy, if this applies. RISKS AND COMPLICATIONS  Pain.  Bleeding.  Scarring.  Infection. BEFORE THE PROCEDURE  You may need to have an ultrasound or other imaging tests to see how large or deep your cystic structure is. Blood tests may also be used to determine if you have an infection or how severe the infection is. You may need to have a tetanus shot. PROCEDURE  The affected area is cleaned with a cleaning fluid. The cyst area will then be numbed with a medicine (local anesthetic). A small incision will be made in the cystic structure. A syringe or catheter may be used to drain the contents of the cystic structure, or the contents may be squeezed out. The area will then be flushed with a cleansing solution. After cleansing the area, it is often gently packed with a gauze or another wound dressing. Once it is packed, it will be covered with gauze and tape or some other type of wound dressing. AFTER THE PROCEDURE   Often, you will be allowed to go home right after the procedure.  You may be given antibiotic medicine to prevent or heal an infection.  If the area was packed with gauze or some other wound dressing, you will likely need to come back in 1 to 2 days to get it removed.  The area should heal in about 14 days.   This information is not intended to replace advice given  to you by your health care provider. Make sure you discuss any questions you have with your health care provider.   Document Released: 02/05/2001 Document Revised: 02/11/2012 Document Reviewed: 10/07/2011 Elsevier Interactive Patient Education 2016 [employer]  

## 2016-01-20 NOTE — ED Notes (Signed)
Dressing placed over site and marked with skin maker

## 2016-01-20 NOTE — ED Provider Notes (Signed)
[facility] Emergency Department Provider Note  ____________________________________________  Time seen: Approximately 11:49 AM  I have reviewed the triage vital signs and the nursing notes.   HISTORY  Chief Complaint Abscess   HPI Sabrina Fuentes is a 45 y.o. female who presents to the emergency department for evaluation of possible abscess to the left buttock. She states that she and her husband have had multiple spider bites this year, but none has become infected. She denies feeling a bite, but does not know what else this could have caused this. She denies history of skin infections or MRSA.  Past Medical History  Diagnosis Date  . Abdominal hernia     There are no active problems to display for this patient.   Past Surgical History  Procedure Laterality Date  . Tubal ligation      Current Outpatient Rx  Name  Route  Sig  Dispense  Refill  . HYDROcodone-acetaminophen (NORCO/VICODIN) 5-325 MG tablet   Oral   Take 1 tablet by mouth every 4 (four) hours as needed.   12 tablet   0   . sulfamethoxazole-trimethoprim (BACTRIM DS,SEPTRA DS) 800-160 MG tablet   Oral   Take 1 tablet by mouth 2 (two) times daily.   20 tablet   0     Allergies Iodinated diagnostic agents and Penicillins  No family history on file.  Social History Social History  Substance Use Topics  . Smoking status: Current Every Day Smoker -- 0.50 packs/day    Types: Cigarettes  . Smokeless tobacco: None  . Alcohol Use: No    Review of Systems  Constitutional: Negative for fever/chills Respiratory: Negative for shortness of breath. Musculoskeletal: Negative for pain. Skin: Positive for tenderness and erythema. Neurological: Negative for headaches, focal weakness or numbness. ____________________________________________   PHYSICAL EXAM:  VITAL SIGNS: ED Triage Vitals  Enc Vitals Group     BP 01/20/16 1028 131/100 mmHg     Pulse Rate 01/20/16 1028 92   Resp 01/20/16 1028 20     Temp 01/20/16 1028 98 F (36.7 C)     Temp Source 01/20/16 1028 Oral     SpO2 01/20/16 1028 94 %     Weight 01/20/16 1028 260 lb (117.935 kg)     Height 01/20/16 1028 5\' 4"  (1.626 m)     Head Cir --      Peak Flow --      Pain Score 01/20/16 1029 9     Pain Loc --      Pain Edu? --      Excl. in [place]? --      Constitutional: Alert and oriented. Well appearing and in no acute distress. Eyes: Conjunctivae are normal. PERRL. EOMI. Nose: No congestion/rhinnorhea. Mouth/Throat: Mucous membranes are moist.   Neck: No stridor. Cardiovascular: Good peripheral circulation. Musculoskeletal: FROM throughout. Neurologic:  Normal speech and language. No gross focal neurologic deficits are appreciated. Skin:  Approximately 12 cm diameter area of erythema noted to the left buttock with a pinpoint darkened area consistent with possible insect bite.  ____________________________________________   LABS (all labs ordered are listed, but only abnormal results are displayed)  Labs Reviewed - No data to display ____________________________________________  EKG   ____________________________________________  RADIOLOGY  Not indicated ____________________________________________   PROCEDURES  Procedure(s) performed:   INCISION AND DRAINAGE Performed by: Sherrie George Consent: Verbal consent obtained. Risks and benefits: risks, benefits and alternatives were discussed Type: abscess  Body area: left buttock  Anesthesia: local infiltration  Incision was made with a scalpel.  Local anesthetic: lidocaine 1% with epinephrine  Anesthetic total: 6 ml  Complexity: complex  Blunt dissection to break up loculations  Drainage: purulent  Drainage amount: Large  Packing material: 1/4 in iodoform gauze  Patient tolerance: Patient tolerated the procedure well with no immediate complications.    ____________________________________________   INITIAL  IMPRESSION / ASSESSMENT AND PLAN / ED COURSE  Pertinent labs & imaging results that were available during my care of the patient were reviewed by me and considered in my medical decision making (see chart for details).  She will be advised to take Bactrim until finished and Norco as prescribed if needed.  She was advised to follow up with PCP in 2 days or return to the ER for wound check and packing removal.  She was also advised to return to the emergency department for symptoms that change or worsen.  ____________________________________________   FINAL CLINICAL IMPRESSION(S) / ED DIAGNOSES  Final diagnoses:  Abscess of buttock, right       Victorino Dike, FNP 01/20/16 1709  Lisa Roca, MD 01/21/16 1120

## 2016-01-20 NOTE — ED Notes (Signed)
Abscess L buttock x 2 days, states is not draining yet.

## 2016-01-22 ENCOUNTER — Emergency Department
Admission: EM | Admit: 2016-01-22 | Discharge: 2016-01-22 | Disposition: A | Discharge status: Home / Self Care | Payer: Self-pay | Attending: Emergency Medicine | Admitting: Emergency Medicine

## 2016-01-22 ENCOUNTER — Encounter: Payer: Self-pay | Admitting: Medical Oncology

## 2016-01-22 DIAGNOSIS — F17210 Nicotine dependence, cigarettes, uncomplicated: Secondary | ICD-10-CM | POA: Insufficient documentation

## 2016-01-22 DIAGNOSIS — L0231 Cutaneous abscess of buttock: Secondary | ICD-10-CM | POA: Insufficient documentation

## 2016-01-22 MED ORDER — LIDOCAINE-EPINEPHRINE (PF) 1 %-1:200000 IJ SOLN
INTRAMUSCULAR | Status: AC
  Filled 2016-01-22: qty 30

## 2016-01-22 MED ORDER — OXYCODONE-ACETAMINOPHEN 7.5-325 MG PO TABS: 1.0000 | ORAL_TABLET | ORAL | Status: AC | PRN

## 2016-01-22 NOTE — ED Notes (Signed)
Pt was seen here Saturday for I&D of abscess, pt here today for re-check.

## 2016-01-22 NOTE — ED Provider Notes (Signed)
[facility] Emergency Department Provider Note   ____________________________________________  Time seen: Approximately 11:25 AM  I have reviewed the triage vital signs and the nursing notes.   HISTORY  Chief Complaint Wound Check    HPI Sabrina Fuentes is a 45 y.o. female patient follow for an I&D was performed 2 days ago. Patient stated erythema has increased and she mistakenly removed the packing was doing the dressing change yesterday. Patient states she is still unable to sit on the affected buttocks. Patient stated areas continued to drain area patient denies any fever associated this complaint. Patient rated the pain as a 10 over 10. Patient is taking Bactrim and norco with this complaint.   Past Medical History  Diagnosis Date  . Abdominal hernia     There are no active problems to display for this patient.   Past Surgical History  Procedure Laterality Date  . Tubal ligation      Current Outpatient Rx  Name  Route  Sig  Dispense  Refill  . HYDROcodone-acetaminophen (NORCO/VICODIN) 5-325 MG tablet   Oral   Take 1 tablet by mouth every 4 (four) hours as needed.   12 tablet   0   . oxyCODONE-acetaminophen (PERCOCET) 7.5-325 MG tablet   Oral   Take 1 tablet by mouth every 4 (four) hours as needed for severe pain.   20 tablet   0   . sulfamethoxazole-trimethoprim (BACTRIM DS,SEPTRA DS) 800-160 MG tablet   Oral   Take 1 tablet by mouth 2 (two) times daily.   20 tablet   0     Allergies Iodinated diagnostic agents and Penicillins  No family history on file.  Social History Social History  Substance Use Topics  . Smoking status: Current Every Day Smoker -- 0.50 packs/day    Types: Cigarettes  . Smokeless tobacco: None  . Alcohol Use: No    Review of Systems Constitutional: No fever/chills Eyes: No visual changes. ENT: No sore throat. Cardiovascular: Denies chest pain. Respiratory: Denies shortness of  breath. Gastrointestinal: No abdominal pain.  No nausea, no vomiting.  No diarrhea.  No constipation. Genitourinary: Negative for dysuria. Musculoskeletal: Negative for back pain. Skin: Negative for rash. Erythematous and edematous nausea lesion right buttocks. Neurological: Negative for headaches, focal weakness or numbness. 10-point ROS otherwise negative.  ____________________________________________   PHYSICAL EXAM:  VITAL SIGNS: ED Triage Vitals  Enc Vitals Group     BP 01/22/16 1051 121/58 mmHg     Pulse Rate 01/22/16 1051 91     Resp 01/22/16 1051 17     Temp 01/22/16 1051 97.7 F (36.5 C)     Temp Source 01/22/16 1051 Oral     SpO2 01/22/16 1051 96 %     Weight 01/22/16 1051 260 lb (117.935 kg)     Height --      Head Cir --      Peak Flow --      Pain Score 01/22/16 1052 10     Pain Loc --      Pain Edu? --      Excl. in [place]? --    Constitutional: Alert and oriented. Well appearing and in no acute distress. Eyes: Conjunctivae are normal. PERRL. EOMI. Head: Atraumatic. Nose: No congestion/rhinnorhea. Mouth/Throat: Mucous membranes are moist.  Oropharynx non-erythematous. Neck: No stridor.  No cervical spine tenderness to palpation. Cardiovascular: Normal rate, regular rhythm. Grossly normal heart sounds.  Good peripheral circulation. Respiratory: Normal respiratory effort.  No retractions. Lungs  CTAB. Gastrointestinal: Soft and nontender. No distention. No abdominal bruits. No CVA tenderness. Musculoskeletal: No lower extremity tenderness nor edema.  No joint effusions. Neurologic:  Normal speech and language. No gross focal neurologic deficits are appreciated. No gait instability. Skin:  Skin is warm, dry and intact. Edematous erythematous area measuring approximately 9-10 cm in circumference. Small incision noted about the site with purulent discharge. Psychiatric: Mood and affect are normal. Speech and behavior are  normal.  ____________________________________________   LABS (all labs ordered are listed, but only abnormal results are displayed)  Labs Reviewed - No data to display ____________________________________________  EKG   ____________________________________________  RADIOLOGY   ____________________________________________   PROCEDURES  Procedure(s) performed: INCISION AND DRAINAGE Performed by: Sable Feil Consent: Verbal consent obtained. Risks and benefits: risks, benefits and alternatives were discussed Type: abscess  Body area: Right buttocks  Anesthesia: local infiltration  Incision was made with a scalpel to widen previous incision site.  Local anesthetic: lidocaine and percent with epinephrine  Anesthetic total: AML ml  Complexity: complex Blunt dissection to break up loculations  Drainage: purulent  Drainage amount: Large   Packing material: 1/4 in iodoform gauze  Patient tolerance: Patient tolerated the procedure well with no immediate complications.     Critical Care performed: No  ____________________________________________   INITIAL IMPRESSION / ASSESSMENT AND PLAN / ED COURSE  Pertinent labs & imaging results that were available during my care of the patient were reviewed by me and considered in my medical decision making (see chart for details).  Abscess right buttocks. Patient given discharge care instructions. Patient advised continue antibiotics as directed. Patient prescription for pain medication refill. Patient advised return back in 2 days for reevaluation. ____________________________________________   FINAL CLINICAL IMPRESSION(S) / ED DIAGNOSES  Final diagnoses:  Abscess of buttock, right      NEW MEDICATIONS STARTED DURING THIS VISIT:  New Prescriptions   OXYCODONE-ACETAMINOPHEN (PERCOCET) 7.5-325 MG TABLET    Take 1 tablet by mouth every 4 (four) hours as needed for severe pain.     Note:  This document was  prepared using Dragon voice recognition software and may include unintentional dictation errors.    Sable Feil, PA-C 01/22/16 1159  Daymon Larsen, MD 01/22/16 1240

## 2016-01-22 NOTE — ED Notes (Signed)
States she is here for abscess recheck ot buttocks  Area red and more swollen

## 2016-01-22 NOTE — Discharge Instructions (Signed)
Percutaneous Abscess Drain, Care After °Refer to this sheet in the next few weeks. These instructions provide you with information on caring for yourself after your procedure. Your health care provider may also give you more specific instructions. Your treatment has been planned according to current medical practices, but problems sometimes occur. Call your health care provider if you have any problems or questions after your procedure. °WHAT TO EXPECT AFTER THE PROCEDURE °After your procedure, it is typical to have the following:  °· A small amount of discomfort in the area where the drainage tube was placed. °· A small amount of bruising around the area where the drainage tube was placed. °· Sleepiness and fatigue for the rest of the day from the medicines used. °HOME CARE INSTRUCTIONS °· Rest at home for 1-2 days following your procedure or as directed by your health care provider. °· If you go home right after the procedure, plan to have someone with you for 24 hours. °· Do not take a bath or shower for 24 hours after your procedure. °· Take medicines only as directed by your health care provider. Ask your health care provider when you can resume taking any normal medicines. °· Change bandages (dressings) as directed.   °· You may be told to record the amount of drainage from the bag every time you empty it. Follow your health care provider's directions for emptying the bag. Write down the amount of drainage, the date, and the time you emptied it. °· Call your health care provider when the drain is putting out less than 10 mL of drainage per day for 2-3 days in a row or as directed by your health care provider. °· Follow your health care provider's instructions for cleaning the drainage tube. You may need to clean the tube every day so that it does not clog. °SEEK MEDICAL CARE IF: °· You have increased bleeding (more than a small spot) from the site where the drainage tube was placed. °· You have redness,  swelling, or increasing pain around the site where the drainage tube was placed. °· You notice a discharge or bad smell coming from the site where the drainage tube was placed. °· You have a fever or chills.  °· You have pain that is not helped by medicine.   °SEEK IMMEDIATE MEDICAL CARE IF: °· There is leakage around the drainage tube. °· The drainage tube pulls out. °· You suddenly stop having drainage from the tube. °· You suddenly have blood in the drainage fluid. °· You become dizzy or faint. °· You develop a rash.   °· You have nausea or vomiting. °· You have difficulty breathing, feel short of breath, or feel faint.   °· You develop chest pain. °· You have problems with your speech or vision. °· You have trouble balancing or moving your arms or legs. °  °This information is not intended to replace advice given to you by your health care provider. Make sure you discuss any questions you have with your health care provider. °  °Document Released: 12/27/2013 Document Revised: 05/31/2014 Document Reviewed: 12/27/2013 °Elsevier Interactive Patient Education ©2016 [employer] ° °

## 2016-01-24 ENCOUNTER — Encounter: Payer: Self-pay | Admitting: Emergency Medicine

## 2016-01-24 ENCOUNTER — Emergency Department
Admission: EM | Admit: 2016-01-24 | Discharge: 2016-01-24 | Disposition: A | Discharge status: Home / Self Care | Payer: Self-pay | Attending: Emergency Medicine | Admitting: Emergency Medicine

## 2016-01-24 DIAGNOSIS — Z4800 Encounter for change or removal of nonsurgical wound dressing: Secondary | ICD-10-CM | POA: Insufficient documentation

## 2016-01-24 DIAGNOSIS — Z5189 Encounter for other specified aftercare: Secondary | ICD-10-CM

## 2016-01-24 DIAGNOSIS — F17210 Nicotine dependence, cigarettes, uncomplicated: Secondary | ICD-10-CM | POA: Insufficient documentation

## 2016-01-24 DIAGNOSIS — L0231 Cutaneous abscess of buttock: Secondary | ICD-10-CM | POA: Insufficient documentation

## 2016-01-24 NOTE — ED Notes (Signed)
Pt states she was seen here on Saturday and had abscess drained to left side of buttocks. Pt was seen on Monday and states they had to cut it again and packing was placed. Pt is back today for follow-up.

## 2016-01-24 NOTE — ED Provider Notes (Signed)
[facility] Emergency Department Provider Note    ____________________________________________  Time seen: Approximately 11:22 AM  I have reviewed the triage vital signs and the nursing notes.   HISTORY  Chief Complaint Wound Check    HPI Sabrina Fuentes is a 45 y.o. female patient for wound check secondary to an I&D 2 the left buttocks. Patient stated this been decreased pain and decreased drainage since the second incision and drainage. Patient states continue pain was sitting on the affected side. Patient also states she experienced fever and chills. Patient denies any nausea vomiting diarrhea. No other palliative measures taken for this complaint.  Past Medical History  Diagnosis Date  . Abdominal hernia     There are no active problems to display for this patient.   Past Surgical History  Procedure Laterality Date  . Tubal ligation      Current Outpatient Rx  Name  Route  Sig  Dispense  Refill  . HYDROcodone-acetaminophen (NORCO/VICODIN) 5-325 MG tablet   Oral   Take 1 tablet by mouth every 4 (four) hours as needed.   12 tablet   0   . oxyCODONE-acetaminophen (PERCOCET) 7.5-325 MG tablet   Oral   Take 1 tablet by mouth every 4 (four) hours as needed for severe pain.   20 tablet   0   . sulfamethoxazole-trimethoprim (BACTRIM DS,SEPTRA DS) 800-160 MG tablet   Oral   Take 1 tablet by mouth 2 (two) times daily.   20 tablet   0     Allergies Iodinated diagnostic agents and Penicillins  History reviewed. No pertinent family history.  Social History Social History  Substance Use Topics  . Smoking status: Current Every Day Smoker -- 0.50 packs/day    Types: Cigarettes  . Smokeless tobacco: None  . Alcohol Use: No    Review of Systems Constitutional: No fever/chills Eyes: No visual changes. ENT: No sore throat. Cardiovascular: Denies chest pain. Respiratory: Denies shortness of breath. Gastrointestinal: No abdominal pain.   No nausea, no vomiting.  No diarrhea.  No constipation. Genitourinary: Negative for dysuria. Musculoskeletal: Negative for back pain. Skin: Negative for rash. Redness and swelling left buttocks Neurological: Negative for headaches, focal weakness or numbness.   ____________________________________________   PHYSICAL EXAM:  VITAL SIGNS: ED Triage Vitals  Enc Vitals Group     BP 01/24/16 1109 109/82 mmHg     Pulse Rate 01/24/16 1105 95     Resp 01/24/16 1105 18     Temp 01/24/16 1105 97.7 F (36.5 C)     Temp Source 01/24/16 1105 Oral     SpO2 01/24/16 1105 97 %     Weight 01/24/16 1105 255 lb (115.667 kg)     Height 01/24/16 1105 5\' 4"  (1.626 m)     Head Cir --      Peak Flow --      Pain Score 01/24/16 1110 5     Pain Loc --      Pain Edu? --      Excl. in [place]? --     Constitutional: Alert and oriented. Well appearing and in no acute distress. Eyes: Conjunctivae are normal. PERRL. EOMI. Head: Atraumatic. Nose: No congestion/rhinnorhea. Mouth/Throat: Mucous membranes are moist.  Oropharynx non-erythematous. Neck: No stridor.  No cervical spine tenderness to palpation. Hematological/Lymphatic/Immunilogical: No cervical lymphadenopathy. Cardiovascular: Normal rate, regular rhythm. Grossly normal heart sounds.  Good peripheral circulation. Respiratory: Normal respiratory effort.  No retractions. Lungs CTAB. Gastrointestinal: Soft and nontender. No distention.  No abdominal bruits. No CVA tenderness. Musculoskeletal: No lower extremity tenderness nor edema.  No joint effusions. Neurologic:  Normal speech and language. No gross focal neurologic deficits are appreciated. No gait instability. Skin:  There is decreased edema and erythema from previous exam. Packing material was removed from incision site. Area was irrigated with clear return. Wound was clean and bandage. Psychiatric: Mood and affect are normal. Speech and behavior are  normal.  ____________________________________________   LABS (all labs ordered are listed, but only abnormal results are displayed)  Labs Reviewed - No data to display ____________________________________________  EKG   ____________________________________________  RADIOLOGY   ____________________________________________   PROCEDURES  Procedure(s) performed: None  Critical Care performed: No  ____________________________________________   INITIAL IMPRESSION / ASSESSMENT AND PLAN / ED COURSE  Pertinent labs & imaging results that were available during my care of the patient were reviewed by me and considered in my medical decision making (see chart for details).  Healing abscess left buttocks. Patient given discharge care instructions. Patient advised to continue previous medication. Patient given a work note and advised follow-up with the ER if no improvement in 3-5 days. ____________________________________________   FINAL CLINICAL IMPRESSION(S) / ED DIAGNOSES  Final diagnoses:  Wound check, abscess      NEW MEDICATIONS STARTED DURING THIS VISIT:  New Prescriptions   No medications on file     Note:  This document was prepared using Dragon voice recognition software and may include unintentional dictation errors.    Sable Feil, PA-C 01/24/16 [place], PA-C 01/24/16 1138  Earleen Newport, MD 01/24/16 [phone]

## 2016-06-26 ENCOUNTER — Encounter: Payer: Self-pay | Admitting: *Deleted

## 2016-06-26 ENCOUNTER — Emergency Department
Admission: EM | Admit: 2016-06-26 | Discharge: 2016-06-26 | Disposition: A | Discharge status: Home / Self Care | Payer: Self-pay | Attending: Emergency Medicine | Admitting: Emergency Medicine

## 2016-06-26 DIAGNOSIS — Z792 Long term (current) use of antibiotics: Secondary | ICD-10-CM | POA: Insufficient documentation

## 2016-06-26 DIAGNOSIS — Z87891 Personal history of nicotine dependence: Secondary | ICD-10-CM | POA: Insufficient documentation

## 2016-06-26 DIAGNOSIS — J0100 Acute maxillary sinusitis, unspecified: Secondary | ICD-10-CM | POA: Insufficient documentation

## 2016-06-26 MED ORDER — FLUTICASONE PROPIONATE 50 MCG/ACT NA SUSP: 2.0000 | Freq: Every day | NASAL | 0 refills | Status: DC

## 2016-06-26 MED ORDER — ACETAMINOPHEN 325 MG PO TABS
650.0000 mg | ORAL_TABLET | Freq: Once | ORAL | Status: AC
  Administered 2016-06-26: 650 mg via ORAL

## 2016-06-26 MED ORDER — DOXYCYCLINE HYCLATE 100 MG PO TABS: 100.0000 mg | ORAL_TABLET | Freq: Two times a day (BID) | ORAL | 0 refills | Status: DC

## 2016-06-26 MED ORDER — ACETAMINOPHEN 500 MG PO TABS
ORAL_TABLET | ORAL | Status: AC
  Filled 2016-06-26: qty 1

## 2016-06-26 NOTE — Discharge Instructions (Signed)
Take the antibiotic as directed. Follow-up with [facility] as needed.  Continue your OTC meds for symptom relief.

## 2016-06-26 NOTE — ED Triage Notes (Signed)
Pt with cough, nasal congestion and fever.  Sx for several days.  Pt taking mucinex without relief.  Pt alert.

## 2016-06-26 NOTE — ED Provider Notes (Signed)
[facility] Emergency Department Provider Note ____________________________________________  Time seen: 1810  I have reviewed the triage vital signs and the nursing notes.  HISTORY  Chief Complaint  Facial Pain and Nasal Congestion  HPI Sabrina Fuentes is a 45 y.o. female presents to the ED for evaluation of continued symptoms of sinus pressure, posterior lymph nodes, and fevers. She was evaluated on 10/16 by her PCP at [facility] clinic and diagnosed with a sinusitis. She was placed on Mucinex D and told to get continue to monitor symptoms. Since that time she's had ongoing sinus congestion, left-sided earache, and sore throat.  Past Medical History:  Diagnosis Date  . Abdominal hernia     There are no active problems to display for this patient.   Past Surgical History:  Procedure Laterality Date  . TUBAL LIGATION      Prior to Admission medications   Medication Sig Start Date End Date Taking? Authorizing Provider  doxycycline (VIBRA-TABS) 100 MG tablet Take 1 tablet (100 mg total) by mouth 2 (two) times daily. 06/26/16   Gloriann Riede V Bacon Leman [place], PA-C  fluticasone (FLONASE) 50 MCG/ACT nasal spray Place 2 sprays into both nostrils daily. 06/26/16   Emmit Oriley V Bacon Shannen Vernon, PA-C  HYDROcodone-acetaminophen (NORCO/VICODIN) 5-325 MG tablet Take 1 tablet by mouth every 4 (four) hours as needed. 01/20/16   Victorino Dike, FNP  oxyCODONE-acetaminophen (PERCOCET) 7.5-325 MG tablet Take 1 tablet by mouth every 4 (four) hours as needed for severe pain. 01/22/16 01/21/17  Sable Feil, PA-C  sulfamethoxazole-trimethoprim (BACTRIM DS,SEPTRA DS) 800-160 MG tablet Take 1 tablet by mouth 2 (two) times daily. 01/20/16   Victorino Dike, FNP    Allergies Iodinated diagnostic agents and Penicillins  No family history on file.  Social History Social History  Substance Use Topics  . Smoking status: Former Smoker    Packs/day: 0.50    Types: Cigarettes  . Smokeless tobacco:  Never Used  . Alcohol use No   Review of Systems  Constitutional: Positive for fever. Eyes: Negative for visual changes. ENT: Positive for sore throat. Sinus pressure and drainage as above. Cardiovascular: Negative for chest pain. Respiratory: Negative for shortness of breath. Gastrointestinal: Negative for abdominal pain, vomiting and diarrhea. Skin: Negative for rash. ____________________________________________  PHYSICAL EXAM:  VITAL SIGNS: ED Triage Vitals  Enc Vitals Group     BP 06/26/16 1736 113/88     Pulse Rate 06/26/16 1736 (!) 115     Resp 06/26/16 1736 20     Temp 06/26/16 1736 (!) 101 F (38.3 C)     Temp Source 06/26/16 1736 Oral     SpO2 06/26/16 1736 96 %     Weight 06/26/16 1737 245 lb (111.1 kg)     Height 06/26/16 1737 5\' 4"  (1.626 m)     Head Circumference --      Peak Flow --      Pain Score 06/26/16 1737 5     Pain Loc --      Pain Edu? --      Excl. in [place]? --    Constitutional: Alert and oriented. Well appearing and in no distress. Head: Normocephalic and atraumatic. Positive sinus pressure and facial tenderness Eyes: Conjunctivae are normal. PERRL. Normal extraocular movements Ears: Canals clear. TMs intact bilaterally. Nose: Right nasal congestion. Purulent nasal rhinorrhea. No epistaxis. Mouth/Throat: Mucous membranes are moist. Uvula is midline and tonsils are flat. No oropharyngeal erythema or lesions are appreciated. Neck: Supple. No thyromegaly. Hematological/Lymphatic/Immunological: Palpable  right sided posterior cervical lymphadenopathy. Cardiovascular: Normal rate, regular rhythm. Normal distal pulses. Respiratory: Normal respiratory effort. No wheezes/rales/rhonchi. Skin:  Skin is warm, dry and intact. No rash noted. Psychiatric: Mood and affect are normal. Patient exhibits appropriate insight and judgment. ____________________________________________  INITIAL IMPRESSION / ASSESSMENT AND PLAN / ED COURSE  Patient was a visit.  Because this with an acute bacterial sinusitis. She is discharged with a profession for doxycycline and Flonase to dose in addition to her previously prescribed Mucinex D. She will follow up with clinic for ongoing symptom management.  Clinical Course   ____________________________________________  FINAL CLINICAL IMPRESSION(S) / ED DIAGNOSES  Final diagnoses:  Acute non-recurrent maxillary sinusitis      Melvenia Needles, PA-C 06/26/16 1848    Hinda Kehr, MD 06/26/16 2013

## 2017-07-08 ENCOUNTER — Other Ambulatory Visit: Payer: Self-pay

## 2017-07-08 ENCOUNTER — Emergency Department
Admission: EM | Admit: 2017-07-08 | Discharge: 2017-07-08 | Disposition: A | Discharge status: Home / Self Care | Payer: Self-pay | Attending: Emergency Medicine | Admitting: Emergency Medicine

## 2017-07-08 ENCOUNTER — Encounter: Payer: Self-pay | Admitting: Emergency Medicine

## 2017-07-08 ENCOUNTER — Emergency Department: Payer: Self-pay

## 2017-07-08 DIAGNOSIS — Z87891 Personal history of nicotine dependence: Secondary | ICD-10-CM | POA: Insufficient documentation

## 2017-07-08 DIAGNOSIS — M79604 Pain in right leg: Secondary | ICD-10-CM | POA: Insufficient documentation

## 2017-07-08 DIAGNOSIS — L03119 Cellulitis of unspecified part of limb: Secondary | ICD-10-CM

## 2017-07-08 DIAGNOSIS — M79605 Pain in left leg: Secondary | ICD-10-CM | POA: Insufficient documentation

## 2017-07-08 DIAGNOSIS — Z79899 Other long term (current) drug therapy: Secondary | ICD-10-CM | POA: Insufficient documentation

## 2017-07-08 LAB — CBC WITH DIFFERENTIAL/PLATELET
BASOS ABS: 0 10*3/uL (ref 0–0.1)
BASOS PCT: 1 %
Eosinophils Absolute: 0.2 10*3/uL (ref 0–0.7)
Eosinophils Relative: 2 %
HEMATOCRIT: 43.6 % (ref 35.0–47.0)
Hemoglobin: 14.6 g/dL (ref 12.0–16.0)
Lymphocytes Relative: 15 %
Lymphs Abs: 1.5 10*3/uL (ref 1.0–3.6)
MCH: 31.7 pg (ref 26.0–34.0)
MCHC: 33.6 g/dL (ref 32.0–36.0)
MCV: 94.6 fL (ref 80.0–100.0)
MONO ABS: 1 10*3/uL — AB (ref 0.2–0.9)
Monocytes Relative: 10 %
NEUTROS ABS: 7.5 10*3/uL — AB (ref 1.4–6.5)
NEUTROS PCT: 72 %
Platelets: 283 10*3/uL (ref 150–440)
RBC: 4.61 MIL/uL (ref 3.80–5.20)
RDW: 13.5 % (ref 11.5–14.5)
WBC: 10.2 10*3/uL (ref 3.6–11.0)

## 2017-07-08 LAB — BASIC METABOLIC PANEL
Anion gap: 9 (ref 5–15)
BUN: 11 mg/dL (ref 6–20)
CALCIUM: 8.8 mg/dL — AB (ref 8.9–10.3)
CO2: 26 mmol/L (ref 22–32)
CREATININE: 0.6 mg/dL (ref 0.44–1.00)
Chloride: 102 mmol/L (ref 101–111)
GFR calc non Af Amer: >60 mL/min (ref >60)
GLUCOSE: 103 mg/dL — AB (ref 65–99)
Potassium: 3.7 mmol/L (ref 3.5–5.1)
SODIUM: 137 mmol/L (ref 135–145)

## 2017-07-08 LAB — PROTIME-INR
INR: 1
Prothrombin Time: 13.1 seconds (ref 11.4–15.2)

## 2017-07-08 LAB — BRAIN NATRIURETIC PEPTIDE: B Natriuretic Peptide: 30 pg/mL (ref 0.0–100.0)

## 2017-07-08 LAB — FIBRIN DERIVATIVES D-DIMER (ARMC ONLY): Fibrin derivatives D-dimer (ARMC): 671.33 ng/mL (FEU) — ABNORMAL HIGH (ref 0.00–499.00)

## 2017-07-08 MED ORDER — CLINDAMYCIN HCL 300 MG PO CAPS: 300.0000 mg | ORAL_CAPSULE | Freq: Three times a day (TID) | ORAL | 0 refills | Status: AC

## 2017-07-08 MED ORDER — CLINDAMYCIN HCL 150 MG PO CAPS
300.0000 mg | ORAL_CAPSULE | Freq: Once | ORAL | Status: AC
  Administered 2017-07-08: 300 mg via ORAL
  Filled 2017-07-08: qty 2

## 2017-07-08 NOTE — ED Triage Notes (Signed)
Patient ambulatory to triage with steady gait, without difficulty or distress noted; pt reports ?chemical reaction on legs but denies any known exposure; st "blotches to leg" noted with redness, blistering, swelling; st symptoms began after starting a new job

## 2017-07-08 NOTE — Discharge Instructions (Signed)
We advised that you follow closely with your doctor tomorrow for a recheck of your legs, if you have increased pain or redness or swelling or fever or chest pain or shortness of breath or nausea or vomiting or you have any other concerning symptoms return to the emergency department.  At this time there is no evidence of a blood clot but if your leg gets more swollen and the redness gets worse please come back.  We asked that you follow-up with your primary care doctor for repeat ultrasound in 1 week to make sure there are no clots.  Keep your feet elevated and wear compression stockings while walking at work.

## 2017-07-08 NOTE — ED Provider Notes (Addendum)
[facility] Emergency Department Provider Note  ____________________________________________   I have reviewed the triage vital signs and the nursing notes.   HISTORY  Chief Complaint Leg Pain    HPI Sabrina Fuentes is a 46 y.o. female presents today complaining of a blotchy red areas to bilateral lower extremities.  Patient started a new job and is on her feet a lot more than normal.  She denies any chest pain or shortness of breath but she does have a remote history, 2006, of pulmonary embolus.  Patient states that her legs feel slightly swollen and there is "fever in them".  Symptoms began on Friday, and have been coming and going since that time.  There are bilateral and very symmetric, these go up a few inches above the ankle, she denies any chest pain or shortness of breath.  She denies any fever or chills.  She denies any chemical exposure.  She states that she has been wearing longer passes if she can protect her legs but she denies wearing Capri pants or anything that exposed that area before.  She feels it is probably just from walking around, states that sometimes her legs do swell when she walks.  Never had this before no other systemic complaints usually gets better when she puts her feet up gets worse when she does not.    Past Medical History:  Diagnosis Date  . Abdominal hernia     There are no active problems to display for this patient.   Past Surgical History:  Procedure Laterality Date  . TUBAL LIGATION      Prior to Admission medications   Medication Sig Start Date End Date Taking? Authorizing Provider  doxycycline (VIBRA-TABS) 100 MG tablet Take 1 tablet (100 mg total) by mouth 2 (two) times daily. 06/26/16   Menshew, Dannielle Karvonen, PA-C  fluticasone (FLONASE) 50 MCG/ACT nasal spray Place 2 sprays into both nostrils daily. 06/26/16   Menshew, Dannielle Karvonen, PA-C  HYDROcodone-acetaminophen (NORCO/VICODIN) 5-325 MG tablet Take 1 tablet by  mouth every 4 (four) hours as needed. 01/20/16   Triplett, Johnette Abraham B, FNP  sulfamethoxazole-trimethoprim (BACTRIM DS,SEPTRA DS) 800-160 MG tablet Take 1 tablet by mouth 2 (two) times daily. 01/20/16   Victorino Dike, FNP    Allergies Aspirin; Iodinated diagnostic agents; and Penicillins  No family history on file.  Social History Social History   Tobacco Use  . Smoking status: Former Smoker    Packs/day: 0.50    Types: Cigarettes  . Smokeless tobacco: Never Used  Substance Use Topics  . Alcohol use: No  . Drug use: No    Review of Systems Constitutional: No fever/chills Eyes: No visual changes. ENT: No sore throat. No stiff neck no neck pain Cardiovascular: Denies chest pain. Respiratory: Denies shortness of breath. Gastrointestinal:   no vomiting.  No diarrhea.  No constipation. Genitourinary: Negative for dysuria. Musculoskeletal: see hpi Skin: HPI Neurological: Negative for severe headaches, focal weakness or numbness.   ____________________________________________   PHYSICAL EXAM:  VITAL SIGNS: ED Triage Vitals  Enc Vitals Group     BP 07/08/17 0454 120/79     Pulse Rate 07/08/17 0454 93     Resp 07/08/17 0454 20     Temp 07/08/17 0454 98.2 F (36.8 C)     Temp src --      SpO2 07/08/17 0454 97 %     Weight 07/08/17 0452 230 lb (104.3 kg)     Height 07/08/17 0452 5'  4" (1.626 m)     Head Circumference --      Peak Flow --      Pain Score 07/08/17 0452 8     Pain Loc --      Pain Edu? --      Excl. in [place]? --     Constitutional: Alert and oriented. Well appearing and in no acute distress. Eyes: Conjunctivae are normal Head: Atraumatic HEENT: No congestion/rhinnorhea. Mucous membranes are moist.  Oropharynx non-erythematous Neck:   Nontender with no meningismus, no masses, no stridor Cardiovascular: Normal rate, regular rhythm. Grossly normal heart sounds.  Good peripheral circulation. Respiratory: Normal respiratory effort.  No retractions. Lungs  CTAB. Abdominal: Soft and nontender. No distention. No guarding no rebound Back:  There is no focal tenderness or step off.  there is no midline tenderness there are no lesions noted. there is no CVA tenderness Musculoskeletal: No lower extremity tenderness, no upper extremity tenderness.  There are bilateral areas of erythema and mild tenderness to go up to the mid calf.  It is very symmetric.  Both seem to go at the same amount.  Note streaks of the leg past.  Warm to touch, erythematous but blanchable non-petechial.  Strong bilateral distal pulses.  No significant swelling but obesity limits exam.  Patient has no crepitus, Neurologic:  Normal speech and language. No gross focal neurologic deficits are appreciated.  Skin:  Skin is warm, dry and intact.  Red warm blotchy rash with no blistering noted to the lower extremities mostly in the area between the ankle and the mid calf bilaterally posterior. Psychiatric: Mood and affect are normal. Speech and behavior are normal.  ____________________________________________   LABS (all labs ordered are listed, but only abnormal results are displayed)  Labs Reviewed  CBC WITH DIFFERENTIAL/PLATELET  BASIC METABOLIC PANEL  FIBRIN DERIVATIVES D-DIMER ([place])  PROTIME-INR  BRAIN NATRIURETIC PEPTIDE    Pertinent labs  results that were available during my care of the patient were reviewed by me and considered in my medical decision making (see chart for details). ____________________________________________  EKG  I personally interpreted any EKGs ordered by me or triage  ____________________________________________  RADIOLOGY  Pertinent labs & imaging results that were available during my care of the patient were reviewed by me and considered in my medical decision making (see chart for details). If possible, patient and/or family made aware of any abnormal findings. ____________________________________________     PROCEDURES  Procedure(s) performed: None  Procedures  Critical Care performed: None  ____________________________________________   INITIAL IMPRESSION / ASSESSMENT AND PLAN / ED COURSE  Pertinent labs & imaging results that were available during my care of the patient were reviewed by me and considered in my medical decision making (see chart for details).  Patient here with a very symmetric bilateral lower extremity rash.  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  This is certainly looks like an exposed bilateral area goes exactly the same amount of distance of each lower leg and therefore seems to be more of an environmental process than anything else.  Will check CBC basic blood work, we will check a bilateral ultrasound to rule out DVT given her history, if that is  all negative for reassuring, patient will be started on empiric antibiotic given the warmth and erythema, we will advised that she stay off her legs for a few days.    ----------------------------------------- 9:51 AM on 07/08/2017 -----------------------------------------  Ambulatory with no difficulty, ultrasound unremarkable, vital signs reassuring.  No recent tick bites nothing to suggest [facility] spotted fever or arborial infection, or Lyme disease etc.  Patient does not appear to have any acute vascular issue such as vascular insufficiency etc. she has very good arterial pulses in any event, could be some degree of venous stasis involved but no evidence of DVT.  Given that there is some degree of redness which is actually improving, we will start her on antibiotics and have her follow closely in the next few days.  She  understands that there is a limitation ultrasound and deep low clots could be missed but there is no indication for anticoagulation at this time I have advised a repeat ultrasound in a week given positive d-dimer.  We will give her a work note and return precautions.  No evidence of systemic illness, this certainly could also be an exposure related localized dermatitis in which case it should get better on its own.  Have advised her to be careful at work with her leg exposure.    ____________________________________________   FINAL CLINICAL IMPRESSION(S) / ED DIAGNOSES  Final diagnoses:  None      This chart was dictated using voice recognition software.  Despite best efforts to proofread,  errors can occur which can change meaning.      Schuyler Amor, MD 07/08/17 4540    Schuyler Amor, MD 07/08/17 9811    Schuyler Amor, MD 07/08/17 [phone]

## 2017-12-28 ENCOUNTER — Emergency Department
Admission: EM | Admit: 2017-12-28 | Discharge: 2017-12-28 | Disposition: A | Discharge status: Home / Self Care | Payer: Self-pay | Attending: Emergency Medicine | Admitting: Emergency Medicine

## 2017-12-28 ENCOUNTER — Other Ambulatory Visit: Payer: Self-pay

## 2017-12-28 ENCOUNTER — Encounter: Payer: Self-pay | Admitting: Emergency Medicine

## 2017-12-28 DIAGNOSIS — K047 Periapical abscess without sinus: Secondary | ICD-10-CM | POA: Insufficient documentation

## 2017-12-28 DIAGNOSIS — Z87891 Personal history of nicotine dependence: Secondary | ICD-10-CM | POA: Insufficient documentation

## 2017-12-28 MED ORDER — MAGIC MOUTHWASH W/LIDOCAINE: 5.0000 mL | Freq: Four times a day (QID) | ORAL | 0 refills | Status: DC

## 2017-12-28 MED ORDER — CLINDAMYCIN HCL 300 MG PO CAPS: 300.0000 mg | ORAL_CAPSULE | Freq: Four times a day (QID) | ORAL | 0 refills | Status: DC

## 2017-12-28 MED ORDER — HYDROCODONE-ACETAMINOPHEN 5-325 MG PO TABS: 1.0000 | ORAL_TABLET | ORAL | 0 refills | Status: DC | PRN

## 2017-12-28 NOTE — ED Triage Notes (Signed)
Here for upper right tooth pain.  Reports has dentist but no longer has dental insurance. No known fever.  Unable to tolerate pain.  No facial swelling ntoed.

## 2017-12-28 NOTE — ED Provider Notes (Signed)
[facility] Emergency Department Provider Note  ____________________________________________  Time seen: Approximately 4:34 PM  I have reviewed the triage vital signs and the nursing notes.   HISTORY  Chief Complaint Dental Pain    HPI Sabrina Fuentes is a 47 y.o. female who presents emergency department complaining of right upper dental pain.  Patient reports that she has known bad dentition, and she does have a dentist however she does not have dental insurance.  Patient reports that she knows she needs significant dental work but has been unable to afford same.  Reports pain, swelling to the area for the past 2 days.  Patient reports the pain is increasing.  She is tried Motrin and Tylenol with no relief.  Patient denies any fevers or chills, difficulty breathing or swallowing.  She denies any chest pain, shortness of breath, abdominal pain, nausea or vomiting.  No other complaints at this time.  No other medications prior to arrival.  Past Medical History:  Diagnosis Date  . Abdominal hernia     There are no active problems to display for this patient.   Past Surgical History:  Procedure Laterality Date  . TUBAL LIGATION      Prior to Admission medications   Medication Sig Start Date End Date Taking? Authorizing Provider  clindamycin (CLEOCIN) 300 MG capsule Take 1 capsule (300 mg total) by mouth 4 (four) times daily. 12/28/17   Cuthriell, Charline Bills, PA-C  fluticasone (FLONASE) 50 MCG/ACT nasal spray Place 2 sprays into both nostrils daily. Patient not taking: Reported on 07/08/2017 06/26/16   Menshew, Dannielle Karvonen, PA-C  HYDROcodone-acetaminophen (NORCO/VICODIN) 5-325 MG tablet Take 1 tablet by mouth every 4 (four) hours as needed for moderate pain. 12/28/17   Cuthriell, Charline Bills, PA-C  magic mouthwash w/lidocaine SOLN Take 5 mLs by mouth 4 (four) times daily. 12/28/17   Cuthriell, Charline Bills, PA-C    Allergies Aspirin; Iodinated diagnostic agents;  and Penicillins  History reviewed. No pertinent family history.  Social History Social History   Tobacco Use  . Smoking status: Former Smoker    Packs/day: 0.50    Types: Cigarettes  . Smokeless tobacco: Never Used  Substance Use Topics  . Alcohol use: No  . Drug use: No     Review of Systems  Constitutional: No fever/chills Eyes: No visual changes. No discharge ENT: Positive for right upper dental pain Cardiovascular: no chest pain. Respiratory: no cough. No SOB. Gastrointestinal: No abdominal pain.  No nausea, no vomiting.   Musculoskeletal: Negative for musculoskeletal pain. Skin: Negative for rash, abrasions, lacerations, ecchymosis. Neurological: Negative for headaches, focal weakness or numbness. 10-point ROS otherwise negative.  ____________________________________________   PHYSICAL EXAM:  VITAL SIGNS: ED Triage Vitals  Enc Vitals Group     BP 12/28/17 1603 127/69     Pulse Rate 12/28/17 1603 69     Resp 12/28/17 1603 18     Temp 12/28/17 1603 98.2 F (36.8 C)     Temp Source 12/28/17 1603 Oral     SpO2 12/28/17 1603 95 %     Weight 12/28/17 1604 230 lb (104.3 kg)     Height 12/28/17 1604 5\' 4"  (1.626 m)     Head Circumference --      Peak Flow --      Pain Score 12/28/17 1613 8     Pain Loc --      Pain Edu? --      Excl. in [place]? --  Constitutional: Alert and oriented. Well appearing and in no acute distress. Eyes: Conjunctivae are normal. PERRL. EOMI. Head: Atraumatic. ENT:      Ears:       Nose: No congestion/rhinnorhea.      Mouth/Throat: Mucous membranes are moist.  Poor dentition throughout with multiple caries, erosions.  Multiple teeth are fractured.  Tooth of concern is tooth #2, this is fractured, caries are visible, mild surrounding erythema and edema.  No purulent drainage.  Uvula is midline.  No other erythema or edema consistent with infection identified in the oropharynx.  No indication of deep space infections. Neck: No  stridor.   Hematological/Lymphatic/Immunilogical: No cervical lymphadenopathy. Cardiovascular: Normal rate, regular rhythm. Normal S1 and S2.  Good peripheral circulation. Respiratory: Normal respiratory effort without tachypnea or retractions. Lungs CTAB. Good air entry to the bases with no decreased or absent breath sounds. Musculoskeletal: Full range of motion to all extremities. No gross deformities appreciated. Neurologic:  Normal speech and language. No gross focal neurologic deficits are appreciated.  Skin:  Skin is warm, dry and intact. No rash noted. Psychiatric: Mood and affect are normal. Speech and behavior are normal. Patient exhibits appropriate insight and judgement.   ____________________________________________   LABS (all labs ordered are listed, but only abnormal results are displayed)  Labs Reviewed - No data to display ____________________________________________  EKG   ____________________________________________  RADIOLOGY   No results found.  ____________________________________________    PROCEDURES  Procedure(s) performed:    Procedures    Medications - No data to display   ____________________________________________   INITIAL IMPRESSION / ASSESSMENT AND PLAN / ED COURSE  Pertinent labs & imaging results that were available during my care of the patient were reviewed by me and considered in my medical decision making (see chart for details).  Review of the [place] CSRS was performed in accordance of the [place] prior to dispensing any controlled drugs.     Patient's diagnosis is consistent with dental infection.  Patient presents emergency department with right upper dentition pain.  Multiple caries, erosions, fractures identified.  Mild surrounding erythema and edema of tooth 2.  No indication for labs or imaging.. Patient will be discharged home with prescriptions for clindamycin, Magic mouthwash, very limited Vicodin.. Patient is to follow  up with dentist as needed or otherwise directed. Patient is given ED precautions to return to the ED for any worsening or new symptoms.     ____________________________________________  FINAL CLINICAL IMPRESSION(S) / ED DIAGNOSES  Final diagnoses:  Dental abscess      NEW MEDICATIONS STARTED DURING THIS VISIT:  ED Discharge Orders        Ordered    clindamycin (CLEOCIN) 300 MG capsule  4 times daily     12/28/17 1644    magic mouthwash w/lidocaine SOLN  4 times daily    Note to Pharmacy:  Dispense in a 1/1/1 ratio. Use lidocaine, diphenhydramine, prednisolone   12/28/17 1644    HYDROcodone-acetaminophen (NORCO/VICODIN) 5-325 MG tablet  Every 4 hours PRN     12/28/17 1644          This chart was dictated using voice recognition software/Dragon. Despite best efforts to proofread, errors can occur which can change the meaning. Any change was purely unintentional.    Darletta Moll, PA-C 12/28/17 1647    Schuyler Amor, MD 12/28/17 2252

## 2017-12-28 NOTE — Discharge Instructions (Signed)
OPTIONS FOR DENTAL FOLLOW UP CARE ° °[place] Department of Health and Human Services - Local Safety Net Dental Clinics °http://www.ncdhhs.gov/dph/oralhealth/services/safetynetclinics.htm °  °[facility] (336-562-3123) ° °[facility] [place] ([phone]) ° °[facility] ([phone] ext [phone]) ° °[facility] ([phone]) ° °[facility] ([phone]) °This clinic caters to the indigent population and is on a lottery system. °Location: °[facility], [address], [place] °Clinic Hours: °Wednesdays from 6pm - 9pm, patients seen by a lottery system. °For dates, call or go to [email or web address]/[facility]/patients/[email or web address] °Services: °Cleanings, fillings and simple extractions. °Payment Options: °DENTAL WORK IS FREE OF CHARGE. Bring proof of income or support. °Best way to get seen: °Arrive at 5:15 pm - this is a lottery, NOT first come/first serve, so arriving earlier will not increase your chances of being seen. °  °  °[facility] °[phone] °Select option 1 for emergencies °  °Location: °[facility], [address], [place] °Clinic Hours: °No walk-ins accepted - call the day before to schedule an appointment. °Check in times are 9:30 am and 1:30 pm. °Services: °Simple extractions, temporary fillings, pulpectomy/pulp debridement, uncomplicated abscess drainage. °Payment Options: °PAYMENT IS DUE AT THE TIME OF SERVICE.  Fee is usually $100-200, additional surgical procedures (e.g. abscess drainage) may be extra. °Cash, checks, Visa/MasterCard accepted.  Can file Medicaid if patient is covered for dental - patient should call case worker to check. °No discount for [facility] patients. °Best way to get seen: °MUST call the day before and get onto the schedule. Can usually be seen the next 1-2 days. No walk-ins accepted. °  °  °[facility] °[phone] °   °Location: °[facility], [address], [place] °Clinic Hours: °M, W, Th, F 8am or 1:30pm, Tues 9a or 1:30 - first come/first served. °Services: °Simple extractions, temporary fillings, uncomplicated abscess drainage.  You do not need to be an [place] resident. °Payment Options: °PAYMENT IS DUE AT THE TIME OF SERVICE. °Dental insurance, otherwise sliding scale - bring proof of income or support. °Depending on income and treatment needed, cost is usually $50-200. °Best way to get seen: °Arrive early as it is first come/first served. °  °  °[facility] °[phone] °  °Location: °[address] °Clinic Hours: °Mon-Thu 8a-5p °Services: °Most basic dental services including extractions and fillings. °Payment Options: °PAYMENT IS DUE AT THE TIME OF SERVICE. °Sliding scale, up to 50% off - bring proof if income or support. °Medicaid with dental option accepted. °Best way to get seen: °Call to schedule an appointment, can usually be seen within 2 weeks OR they will try to see walk-ins - show up at 8a or 2p (you may have to wait). °  °  °[facility] °[phone] °[place] RESIDENTS ONLY °  °Location: °[facility], [address], [place], [place] [address] °Clinic Hours: By appointment only. °Monday - Thursday 8am-5pm, Friday 8am-12pm °Services: Cleanings, fillings, extractions. °Payment Options: °PAYMENT IS DUE AT THE TIME OF SERVICE. °Cash, Visa or MasterCard. Sliding scale - $30 minimum per service. °Best way to get seen: °Come in to office, complete packet and make an appointment - need proof of income °or support monies for each household member and proof of [place] residence. °Usually takes about a month to get in. °  °  °[facility] °[phone] °  °Location: °[address]   [place] °Clinic Hours: Walk-in Urgent Care Dental Services are offered Monday-Friday  mornings only. °The numbers of emergencies accepted daily is limited to the number of °providers available. °Maximum 15 - Mondays, Wednesdays & Thursdays °Maximum 10 - Tuesdays & Fridays °Services: °You do not need to be a [place] resident to be seen for a dental emergency. °Emergencies are defined as pain, swelling, abnormal bleeding, or dental trauma. Walkins will receive x-rays if needed. °NOTE: Dental cleaning is not an emergency. °Payment Options: °PAYMENT IS DUE AT THE TIME OF SERVICE. °Minimum co-pay is $40.00 for uninsured patients. °Minimum co-pay is $3.00 for Medicaid with dental coverage. °Dental Insurance is accepted and must be presented at time of visit. °Medicare does not cover dental. °Forms of payment: Cash, credit card, checks. °Best way to get seen: °If not previously registered with the clinic, walk-in dental registration begins at 7:15 am and is on a first come/first serve basis. °If previously registered with the clinic, call to make an appointment. °  °  °[facility] °[phone] °[place] RESIDENTS ONLY °  °Location: °[address], [place], [place] °Clinic Hours: °Mon-Thu 10a-2p °Services: Extractions only! °Payment Options: °FREE (donations accepted) - bring proof of income or support °Best way to get seen: °Call and schedule an appointment OR come at 8am on the 1st Monday of every month (except for holidays) when it is first come/first served. °  °  °[facility] °[phone] °  °Location: °[address], [place] °Clinic Hours: °Friday mornings °Services, Payment Options, Best way to get seen: °Call for info °

## 2019-04-25 ENCOUNTER — Emergency Department
Admission: EM | Admit: 2019-04-25 | Discharge: 2019-04-25 | Discharge status: Against Medical Advice | Payer: Self-pay | Attending: Emergency Medicine | Admitting: Emergency Medicine

## 2019-04-25 ENCOUNTER — Emergency Department: Payer: Self-pay

## 2019-04-25 ENCOUNTER — Other Ambulatory Visit: Payer: Self-pay

## 2019-04-25 DIAGNOSIS — Z5321 Procedure and treatment not carried out due to patient leaving prior to being seen by health care provider: Secondary | ICD-10-CM | POA: Insufficient documentation

## 2019-04-25 LAB — COOXEMETRY PANEL
Carboxyhemoglobin: 3.9 % — ABNORMAL HIGH (ref 0.5–1.5)
Methemoglobin: 0.6 % (ref 0.0–1.5)
O2 Saturation: 46.5 %
Total oxygen content: 44.4 mL/dL
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
## 2019-04-26 ENCOUNTER — Telehealth: Payer: Self-pay | Admitting: Emergency Medicine

## 2019-04-26 NOTE — Telephone Encounter (Signed)
Called patient due to lwot to inquire about condition and follow up plans. She says she is still feeling same.  Has a call in to pcp to have them look at her labs and xray results.

## 2020-06-05 ENCOUNTER — Emergency Department: Payer: Medicaid Other

## 2020-06-05 ENCOUNTER — Encounter: Payer: Self-pay | Admitting: Emergency Medicine

## 2020-06-05 ENCOUNTER — Other Ambulatory Visit: Payer: Self-pay

## 2020-06-05 DIAGNOSIS — Z20822 Contact with and (suspected) exposure to covid-19: Secondary | ICD-10-CM | POA: Diagnosis not present

## 2020-06-05 DIAGNOSIS — F17210 Nicotine dependence, cigarettes, uncomplicated: Secondary | ICD-10-CM | POA: Insufficient documentation

## 2020-06-05 DIAGNOSIS — R072 Precordial pain: Secondary | ICD-10-CM | POA: Insufficient documentation

## 2020-06-05 LAB — CBC
HCT: 39.3 % (ref 36.0–46.0)
Hemoglobin: 13.2 g/dL (ref 12.0–15.0)
MCH: 31.7 pg (ref 26.0–34.0)
MCHC: 33.6 g/dL (ref 30.0–36.0)
MCV: 94.2 fL (ref 80.0–100.0)
Platelets: 341 10*3/uL (ref 150–400)
RBC: 4.17 MIL/uL (ref 3.87–5.11)
RDW: 13.8 % (ref 11.5–15.5)
WBC: 8.2 10*3/uL (ref 4.0–10.5)
nRBC: 0 % (ref 0.0–0.2)

## 2020-06-05 LAB — POCT PREGNANCY, URINE: Preg Test, Ur: NEGATIVE

## 2020-06-05 NOTE — ED Triage Notes (Signed)
Pt presents to ED with chest pain this evening. Pt reports intermittent sharp left sided chest pain that started about 3 hours ago while pt was sitting down at home. Seen by pcp for pressure in her chest and back about two weeks ago and pt was referred to cardiology. No appt made yet. Pt has Hx of PE 14 years ago. Reports shortness of breath with activity for the past month.

## 2020-06-06 ENCOUNTER — Emergency Department
Admission: EM | Admit: 2020-06-06 | Discharge: 2020-06-06 | Disposition: A | Discharge status: Home / Self Care | Payer: Medicaid Other | Attending: Emergency Medicine | Admitting: Emergency Medicine

## 2020-06-06 DIAGNOSIS — R079 Chest pain, unspecified: Secondary | ICD-10-CM

## 2020-06-06 DIAGNOSIS — Z20822 Contact with and (suspected) exposure to covid-19: Secondary | ICD-10-CM

## 2020-06-06 DIAGNOSIS — Z720 Tobacco use: Secondary | ICD-10-CM

## 2020-06-06 LAB — BASIC METABOLIC PANEL
Anion gap: 8 (ref 5–15)
BUN: 18 mg/dL (ref 6–20)
CO2: 26 mmol/L (ref 22–32)
Calcium: 8.4 mg/dL — ABNORMAL LOW (ref 8.9–10.3)
Chloride: 103 mmol/L (ref 98–111)
Creatinine, Ser: 0.84 mg/dL (ref 0.44–1.00)
GFR, Estimated: >60 mL/min (ref >60)
Glucose, Bld: 107 mg/dL — ABNORMAL HIGH (ref 70–99)
Potassium: 4 mmol/L (ref 3.5–5.1)
Sodium: 137 mmol/L (ref 135–145)

## 2020-06-06 LAB — RESPIRATORY PANEL BY RT PCR (FLU A&B, COVID)
Influenza A by PCR: NEGATIVE
Influenza B by PCR: NEGATIVE
SARS Coronavirus 2 by RT PCR: NEGATIVE

## 2020-06-06 LAB — TROPONIN I (HIGH SENSITIVITY)
Troponin I (High Sensitivity): 4 ng/L (ref <18)
Troponin I (High Sensitivity): 3 ng/L (ref <18)

## 2020-06-06 LAB — FIBRIN DERIVATIVES D-DIMER (ARMC ONLY): Fibrin derivatives D-dimer (ARMC): 410.33 ng/mL (FEU) (ref 0.00–499.00)

## 2020-06-06 NOTE — ED Notes (Signed)
See triage note, pt to ED for intermittent left CP that started last night. Hx blood clots.  Denies SHOB. NAD noted, ambulatory to treatment room

## 2020-06-06 NOTE — ED Provider Notes (Signed)
[facility] Emergency Department Provider Note  ____________________________________________   First MD Initiated Contact with Patient 06/06/20 1037     (approximate)  I have reviewed the triage vital signs and the nursing notes.   HISTORY  Chief Complaint Chest Pain   HPI Sabrina Fuentes is a 49 y.o. female with past medical history of an abdominal hernia and remote PE when she was a teenager attributed at that time to hormonal birth control who presents for assessment of approximately 3 weeks of some substernal chest pressure that became sharp over the last 24 hours.  He states she also feels very sore in her neck.  No prior similar episodes.  No clear alleviating or aggravating factors.  Patient does note her husband had Covid last month although she tested negative.  She has had a cough for several weeks but has been nonproductive she denies shortness of breath, fevers, headache, earache, sore throat, vomiting, diarrhea, dysuria, back pain, rash, extremity pain, or other acute complaints.  She does note she is a smoker but denies EtOH or illicit drug use.         Past Medical History:  Diagnosis Date   Abdominal hernia     There are no problems to display for this patient.   Past Surgical History:  Procedure Laterality Date   TUBAL LIGATION      Prior to Admission medications   Medication Sig Start Date End Date Taking? Authorizing Provider  clindamycin (CLEOCIN) 300 MG capsule Take 1 capsule (300 mg total) by mouth 4 (four) times daily. 12/28/17   Cuthriell, Charline Bills, PA-C  fluticasone (FLONASE) 50 MCG/ACT nasal spray Place 2 sprays into both nostrils daily. Patient not taking: Reported on 07/08/2017 06/26/16   Menshew, Dannielle Karvonen, PA-C  HYDROcodone-acetaminophen (NORCO/VICODIN) 5-325 MG tablet Take 1 tablet by mouth every 4 (four) hours as needed for moderate pain. 12/28/17   Cuthriell, Charline Bills, PA-C  magic mouthwash w/lidocaine SOLN Take  5 mLs by mouth 4 (four) times daily. 12/28/17   Cuthriell, Charline Bills, PA-C    Allergies Aspirin, Iodinated diagnostic agents, and Penicillins  No family history on file.  Social History Social History   Tobacco Use   Smoking status: Current Every Day Smoker    Packs/day: 0.50    Types: Cigarettes   Smokeless tobacco: Never Used  Vaping Use   Vaping Use: Never used  Substance Use Topics   Alcohol use: No   Drug use: No    Review of Systems  Review of Systems  Constitutional: Negative for chills and fever.  HENT: Negative for sore throat.   Eyes: Negative for pain.  Respiratory: Positive for cough. Negative for stridor.   Cardiovascular: Positive for chest pain.  Gastrointestinal: Negative for vomiting.  Genitourinary: Negative for dysuria.  Musculoskeletal: Negative for myalgias.  Skin: Negative for rash.  Neurological: Negative for seizures, loss of consciousness and headaches.  Psychiatric/Behavioral: Negative for suicidal ideas.  All other systems reviewed and are negative.     ____________________________________________   PHYSICAL EXAM:  VITAL SIGNS: ED Triage Vitals  Enc Vitals Group     BP 06/06/20 0139 (!) 133/106     Pulse Rate 06/06/20 0139 75     Resp 06/06/20 0139 20     Temp 06/06/20 0838 97.8 F (36.6 C)     Temp Source 06/06/20 0838 Oral     SpO2 06/06/20 0139 97 %     Weight 06/05/20 2327 280 lb (127 kg)  Height 06/05/20 2327 5\' 4"  (1.626 m)     Head Circumference --      Peak Flow --      Pain Score 06/05/20 2326 4     Pain Loc --      Pain Edu? --      Excl. in [place]? --    Vitals:   06/06/20 0957 06/06/20 1045  BP: 115/75 (!) 136/92  Pulse: 85 71  Resp: 16 20  Temp: 98 F (36.7 C) 98 F (36.7 C)  SpO2: 96% 99%   Physical Exam Vitals and nursing note reviewed.  Constitutional:      General: She is not in acute distress.    Appearance: She is well-developed. She is obese.  HENT:     Head: Normocephalic and  atraumatic.     Right Ear: External ear normal.     Left Ear: External ear normal.     Nose: Nose normal.  Eyes:     Conjunctiva/sclera: Conjunctivae normal.  Cardiovascular:     Rate and Rhythm: Normal rate and regular rhythm.     Heart sounds: No murmur heard.   Pulmonary:     Effort: Pulmonary effort is normal. No respiratory distress.     Breath sounds: Normal breath sounds.  Abdominal:     Palpations: Abdomen is soft.     Tenderness: There is no abdominal tenderness.  Musculoskeletal:     Cervical back: Neck supple.  Skin:    General: Skin is warm and dry.     Capillary Refill: Capillary refill takes less than 2 seconds.  Neurological:     Mental Status: She is alert and oriented to person, place, and time.  Psychiatric:        Mood and Affect: Mood normal.      ____________________________________________   LABS (all labs ordered are listed, but only abnormal results are displayed)  Labs Reviewed  BASIC METABOLIC PANEL - Abnormal; Notable for the following components:      Result Value   Glucose, Bld 107 (*)    Calcium 8.4 (*)    All other components within normal limits  RESPIRATORY PANEL BY RT PCR (FLU A&B, COVID)  CBC  FIBRIN DERIVATIVES D-DIMER ([facility] ONLY)  POC URINE PREG, ED  POCT PREGNANCY, URINE  TROPONIN I (HIGH SENSITIVITY)  TROPONIN I (HIGH SENSITIVITY)   ____________________________________________  EKG  Sinus rhythm with a ventricular rate of 71, normal axis, unremarkable intervals, no clear evidence of acute ischemia although there is nonspecific ST change in lead III. ____________________________________________  RADIOLOGY  ED MD interpretation: No focal consolidation, pneumothorax, edema, effusion, widened mediastinum, or other acute intrathoracic process.  Official radiology report(s): DG Chest 2 View  Result Date: 06/05/2020 CLINICAL DATA:  Chest pain EXAM: CHEST - 2 VIEW COMPARISON:  Chest x-ray 04/25/2019, CT chest 08/31/2015  FINDINGS: The heart size and mediastinal contours are within normal limits. No focal consolidation. No pulmonary edema. No pleural effusion. No pneumothorax. No acute osseous abnormality. IMPRESSION: No active cardiopulmonary disease. Electronically Signed   By: Iven Finn M.D.   On: 06/05/2020 23:47    ____________________________________________   PROCEDURES  Procedure(s) performed (including Critical Care):  Procedures   ____________________________________________   INITIAL IMPRESSION / ASSESSMENT AND PLAN / ED COURSE        Patient presents with above to history exam for assessment of some sharp substernal chest pain radiating to her back associated with several weeks of substernal chest pressure and cough.  Patient also states  she has some soreness in her bilateral neck muscles.  On arrival she is afebrile and hemodynamically stable on room air.  Differential includes but is not limited to ACS, PE, pneumonia, Covid, acute symptomatic anemia, myocarditis, pericarditis, pleurisy, dissection, gastritis.  Low suspicion for ACS given 2 nonelevated troponins over 2 hours and aside from nonspecific change in lead III no other clear evidence of acute ischemia.  Low suspicion for PE as patient's D-dimer is less than 500.  Chest x-ray shows no evidence of focal consolidation suggestive of pneumonia and given patient does not have a fever or elevated white blood cell count of low suspicion for bacterial infection at this time.  Covid PCR was sent as bronchitis as well as viral pleurisy are within the differential.  Presentation and EKG and troponin are not consistent with myocarditis or significant pericarditis.  Patient has no widening of her mediastinum and symmetric bilateral radial pulses and have a low suspicion for dissection at this time.  Given stable vital signs with otherwise reassuring exam and work-up believe patient safe for discharge with continued outpatient evaluation.   Patient declined analgesia in the ED and explained that they believed it was safe for her to take Tylenol and ibuprofen for discomfort as I have a higher suspicion for pleurisy and bronchitis then gastritis at this time.  Advised patient to schedule follow-up appointment with her PCP.  Advised patient to decrease her smoking.  Patient discharged condition per strict precautions advised and discussed.  ____________________________________________   FINAL CLINICAL IMPRESSION(S) / ED DIAGNOSES  Final diagnoses:  Chest pain, unspecified type  Person under investigation for COVID-19  Tobacco abuse    Medications - No data to display   ED Discharge Orders    None       Note:  This document was prepared using Dragon voice recognition software and may include unintentional dictation errors.   Lucrezia Starch, MD 06/06/20 1053

## 2020-09-26 ENCOUNTER — Other Ambulatory Visit: Payer: Self-pay | Admitting: Family Medicine

## 2020-09-26 DIAGNOSIS — Z1231 Encounter for screening mammogram for malignant neoplasm of breast: Secondary | ICD-10-CM

## 2020-10-31 ENCOUNTER — Other Ambulatory Visit: Payer: Self-pay

## 2020-10-31 ENCOUNTER — Ambulatory Visit
Admission: RE | Admit: 2020-10-31 | Discharge: 2020-10-31 | Disposition: A | Discharge status: Home / Self Care | Payer: Medicaid Other | Source: Ambulatory Visit | Attending: Family Medicine | Admitting: Family Medicine

## 2020-10-31 DIAGNOSIS — Z1231 Encounter for screening mammogram for malignant neoplasm of breast: Secondary | ICD-10-CM | POA: Diagnosis present

## 2021-02-06 DIAGNOSIS — Z713 Dietary counseling and surveillance: Secondary | ICD-10-CM | POA: Diagnosis not present

## 2021-02-06 DIAGNOSIS — R7309 Other abnormal glucose: Secondary | ICD-10-CM | POA: Diagnosis not present

## 2021-05-14 DIAGNOSIS — R21 Rash and other nonspecific skin eruption: Secondary | ICD-10-CM | POA: Diagnosis not present

## 2021-05-14 DIAGNOSIS — Z0000 Encounter for general adult medical examination without abnormal findings: Secondary | ICD-10-CM | POA: Diagnosis not present

## 2021-07-11 ENCOUNTER — Ambulatory Visit
Admission: EM | Admit: 2021-07-11 | Discharge: 2021-07-11 | Disposition: A | Discharge status: Home / Self Care | Payer: Medicaid Other | Attending: Emergency Medicine | Admitting: Emergency Medicine

## 2021-07-11 ENCOUNTER — Encounter: Payer: Self-pay | Admitting: Emergency Medicine

## 2021-07-11 DIAGNOSIS — J329 Chronic sinusitis, unspecified: Secondary | ICD-10-CM | POA: Diagnosis not present

## 2021-07-11 DIAGNOSIS — B9689 Other specified bacterial agents as the cause of diseases classified elsewhere: Secondary | ICD-10-CM | POA: Diagnosis not present

## 2021-07-11 MED ORDER — DOXYCYCLINE HYCLATE 100 MG PO CAPS: 100.0000 mg | ORAL_CAPSULE | Freq: Two times a day (BID) | ORAL | 0 refills | Status: DC

## 2021-07-11 NOTE — ED Provider Notes (Addendum)
CHIEF COMPLAINT:   Chief Complaint  Patient presents with   Cough   Nasal Congestion     SUBJECTIVE/HPI:  HPI A very pleasant 50 y.o.Female presents today with cough and nasal congestion onset about a month ago. Patient does not report any shortness of breath, chest pain, palpitations, visual changes, weakness, tingling, headache, nausea, vomiting, diarrhea, fever, chills.   has a past medical history of Abdominal hernia.  ROS:  Review of Systems See Subjective/HPI Medications, Allergies and Problem List personally reviewed in Epic today OBJECTIVE:   Vitals:   07/11/21 1027  BP: 128/78  Pulse: 74  Resp: 18  Temp: 98.6 F (37 C)  SpO2: 98%    Physical Exam   General: Appears well-developed and well-nourished. No acute distress.  HEENT Head: Normocephalic and atraumatic.  + frontal, maxillary sinus tenderness noted to palpation. Ears: Hearing grossly intact, no drainage or visible deformity.  Nose: No nasal deviation.   Mouth/Throat: No stridor or tracheal deviation.  Non erythematous posterior pharynx noted with clear drainage present.  No white patchy exudate noted. Eyes: Conjunctivae and EOM are normal. No eye drainage or scleral icterus bilaterally.  Neck: Normal range of motion, neck is supple.  Cardiovascular: Normal rate. Regular rhythm; no murmurs, gallops, or rubs.  Pulm/Chest: No respiratory distress. Breath sounds normal bilaterally without wheezes, rhonchi, or rales.  Neurological: Alert and oriented to person, place, and time.  Skin: Skin is warm and dry.  No rashes, lesions, abrasions or bruising noted to skin.   Psychiatric: Normal mood, affect, behavior, and thought content.   Vital signs and nursing note reviewed.   Patient stable and cooperative with examination. PROCEDURES:    LABS/X-RAYS/EKG/MEDS:   No results found for any visits on 07/11/21.  MEDICAL DECISION MAKING:   Patient presents with cough and nasal congestion onset about a month  ago. Patient does not report any shortness of breath, chest pain, palpitations, visual changes, weakness, tingling, headache, nausea, vomiting, diarrhea, fever, chills.  Given symptoms along with assessment findings, likely bacterial sinusitis.  Rx doxycycline to the patient's preferred pharmacy. Advised rest, fluids, Tylenol, ibuprofen, Mucinex and Sudafed.  Follow-up with PCP if not improving over the next 5 to 7 days.  Return to clinic for any new or worse swelling or redness in your face, around your eyes or new high fever.  Patient verbalized understanding and agreed with treatment plan.  Patient stable upon discharge. ASSESSMENT/PLAN:  1. Bacterial sinusitis - doxycycline (VIBRAMYCIN) 100 MG capsule; Take 1 capsule (100 mg total) by mouth 2 (two) times daily.  Dispense: 20 capsule; Refill: 0 Instructions about new medications and side effects provided.  Plan:   Discharge Instructions      Take Doxycycline as prescribed.  Sinusitis is an infection of the lining of the sinus cavities in your head. Sinusitis often follows a cold. It causes pain and pressure in your head and face. Take antibiotics as directed. Do not stop taking them just because you feel better. You need to take the full course of antibiotics. Rest, push lots of fluids (especially water), and utilize supportive care for symptoms. Breathe warm, moist area from a steamy shower, hot bath, or sink filled with hot water.  Avoid cold, dry air.  Using a humidifier in your home may help.  Follow the directions for cleaning the machine. Put a hot, wet towel or a warm gel pack on your face 3-4 times a day for 5-10 minutes each time. You may take acetaminophen (Tylenol) every 4-6  hours and ibuprofen every 6-8 hours for muscle pain, joint pain, headaches (you may also alternate these medications). Sudafed (pseudophedrine) is sold behind the counter and can help reduce nasal pressure; avoid taking this if you have high blood pressure or  feel jittery. Sudafed PE (phenylephrine) can be a helpful, short-term, over-the-counter alternative to limit side effects or if you have high blood pressure.  Flonase nasal spray can help alleviate congestion and sinus pressure. Many patients choose Afrin as a nasal decongestant; do not use for more than 3 days for risk of rebound (increased symptoms after stopping medication).  Saline nasal sprays or rinses can also help nasal congestion (use bottled or sterile water). Warm tea with lemon and honey can sooth sore throat and cough, as can cough drops.   Return to clinic for new or worse swelling or redness in your face or around your eyes, or if you have a new or higher fever.          Sabrina Fuentes, [place] 07/11/21 [place]    Sabrina Fuentes, [place] 07/11/21 1042

## 2021-07-11 NOTE — ED Triage Notes (Signed)
Pt here with URI x 1 month.

## 2021-07-11 NOTE — Discharge Instructions (Signed)
Take Doxycycline as prescribed.  Sinusitis is an infection of the lining of the sinus cavities in your head. Sinusitis often follows a cold. It causes pain and pressure in your head and face. Take antibiotics as directed. Do not stop taking them just because you feel better. You need to take the full course of antibiotics. Rest, push lots of fluids (especially water), and utilize supportive care for symptoms. Breathe warm, moist area from a steamy shower, hot bath, or sink filled with hot water.  Avoid cold, dry air.  Using a humidifier in your home may help.  Follow the directions for cleaning the machine. Put a hot, wet towel or a warm gel pack on your face 3-4 times a day for 5-10 minutes each time. You may take acetaminophen (Tylenol) every 4-6 hours and ibuprofen every 6-8 hours for muscle pain, joint pain, headaches (you may also alternate these medications). Sudafed (pseudophedrine) is sold behind the counter and can help reduce nasal pressure; avoid taking this if you have high blood pressure or feel jittery. Sudafed PE (phenylephrine) can be a helpful, short-term, over-the-counter alternative to limit side effects or if you have high blood pressure.  Flonase nasal spray can help alleviate congestion and sinus pressure. Many patients choose Afrin as a nasal decongestant; do not use for more than 3 days for risk of rebound (increased symptoms after stopping medication).  Saline nasal sprays or rinses can also help nasal congestion (use bottled or sterile water). Warm tea with lemon and honey can sooth sore throat and cough, as can cough drops.   Return to clinic for new or worse swelling or redness in your face or around your eyes, or if you have a new or higher fever.

## 2021-07-22 DIAGNOSIS — S46811A Strain of other muscles, fascia and tendons at shoulder and upper arm level, right arm, initial encounter: Secondary | ICD-10-CM | POA: Diagnosis not present

## 2021-07-28 DIAGNOSIS — M7541 Impingement syndrome of right shoulder: Secondary | ICD-10-CM | POA: Diagnosis not present

## 2021-08-15 DIAGNOSIS — Z0000 Encounter for general adult medical examination without abnormal findings: Secondary | ICD-10-CM | POA: Diagnosis not present

## 2021-08-15 DIAGNOSIS — Z713 Dietary counseling and surveillance: Secondary | ICD-10-CM | POA: Diagnosis not present

## 2021-09-25 DIAGNOSIS — U071 COVID-19: Secondary | ICD-10-CM | POA: Diagnosis not present

## 2021-09-25 DIAGNOSIS — R051 Acute cough: Secondary | ICD-10-CM | POA: Diagnosis not present

## 2021-11-10 IMAGING — MG MM DIGITAL SCREENING BILAT W/ TOMO AND CAD
6 of 12 series · 6 of 36 positions shown · non-contrast
Comparison: None.

CLINICAL DATA: Screening.

EXAM:
DIGITAL SCREENING BILATERAL MAMMOGRAM WITH TOMOSYNTHESIS AND CAD
TECHNIQUE: Bilateral screening digital craniocaudal and mediolateral oblique
mammograms were obtained. Bilateral screening digital breast
tomosynthesis was performed. The images were evaluated with
computer-aided detection.

[R MLO synth-2D (1 of 2)]
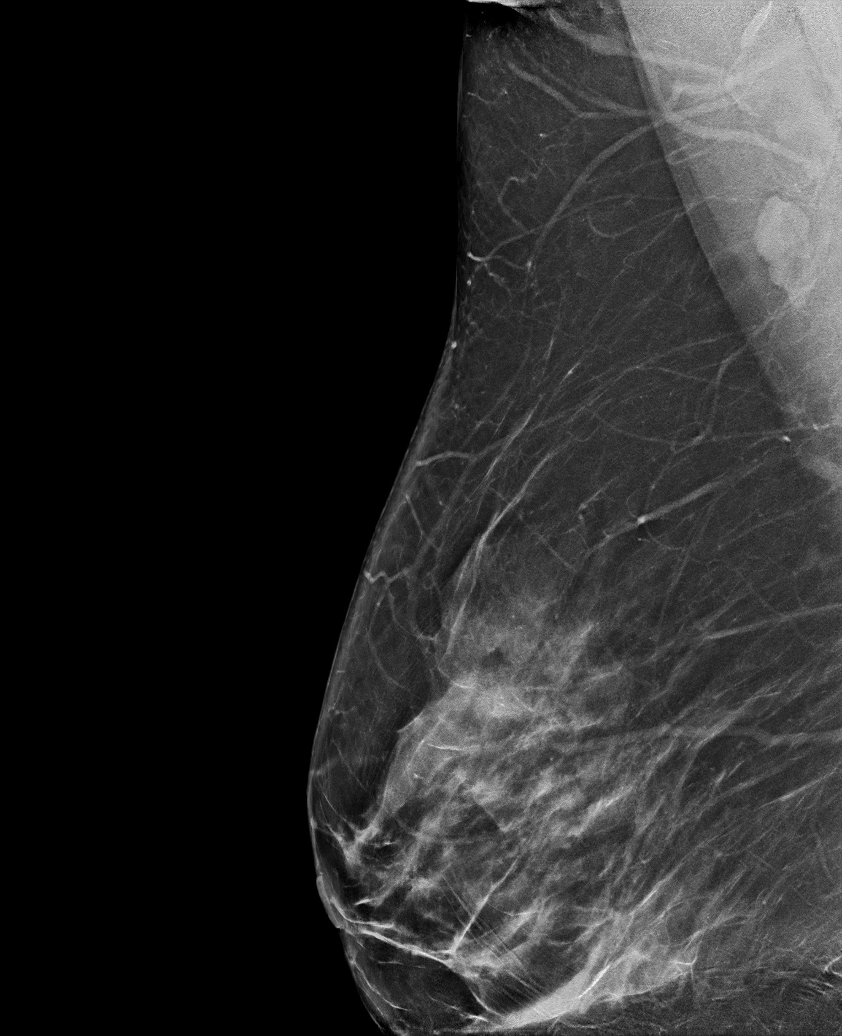

[R CC synth-2D]
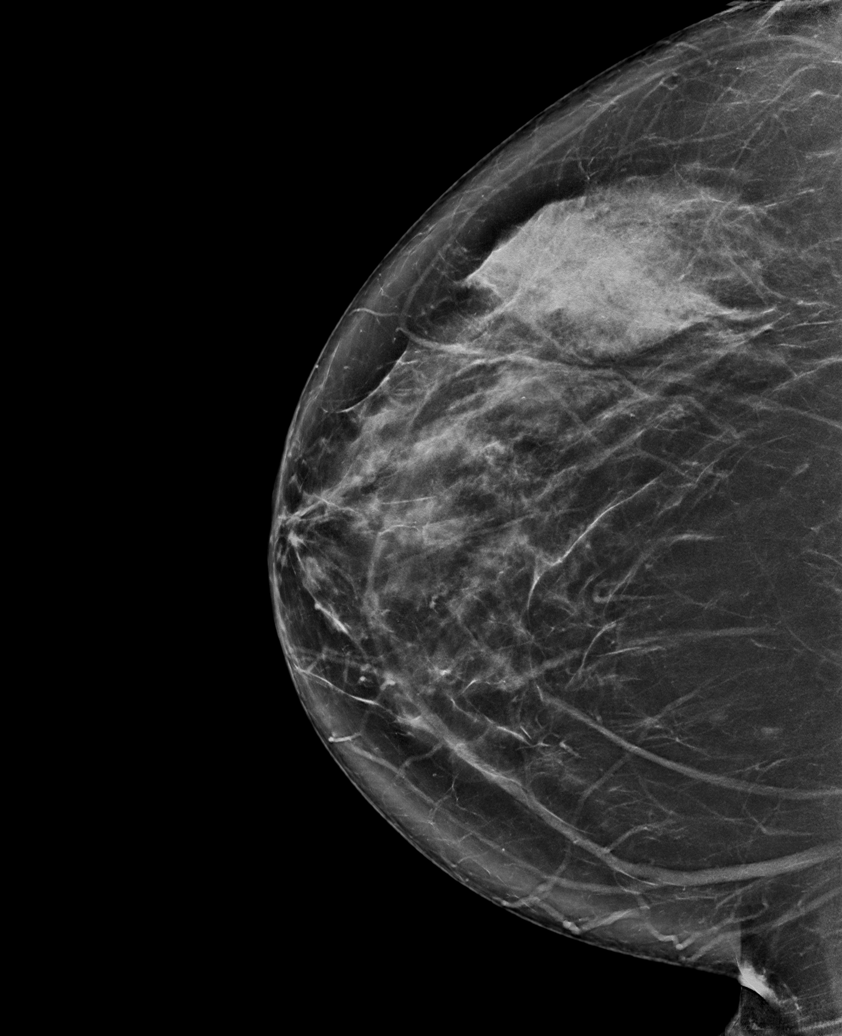

[L MLO synth-2D (1 of 2)]
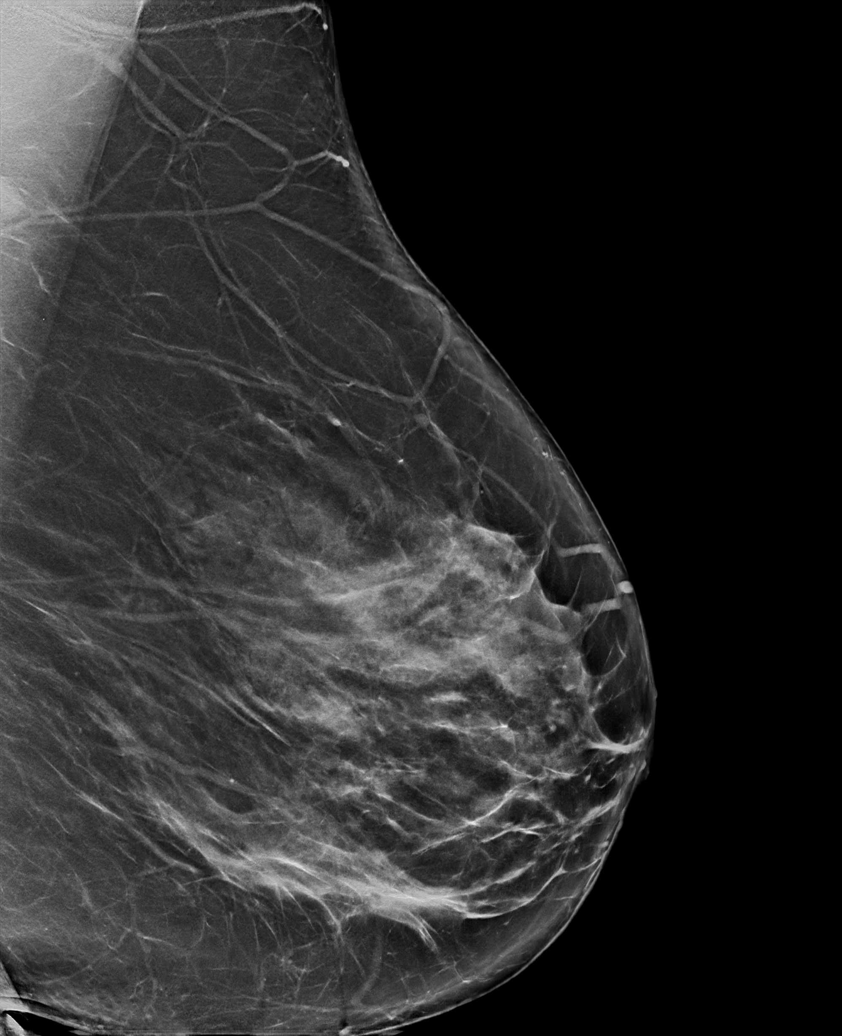

[L MLO synth-2D (2 of 2)]
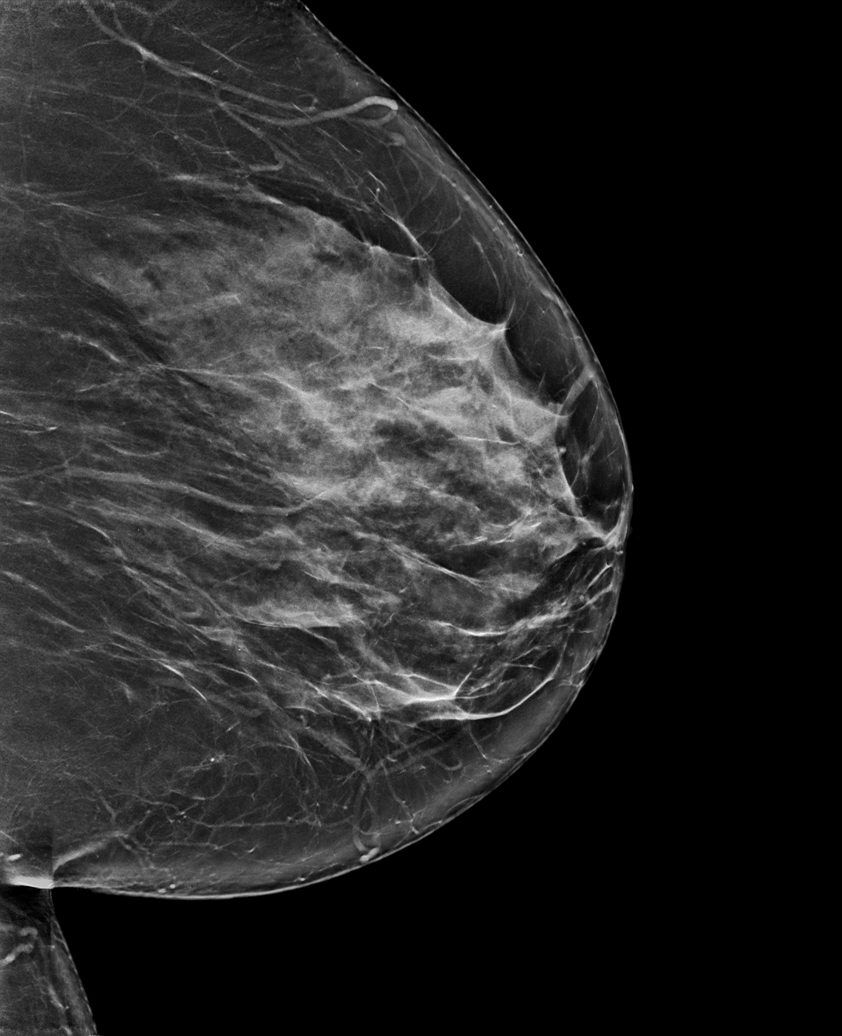

[L CC synth-2D]
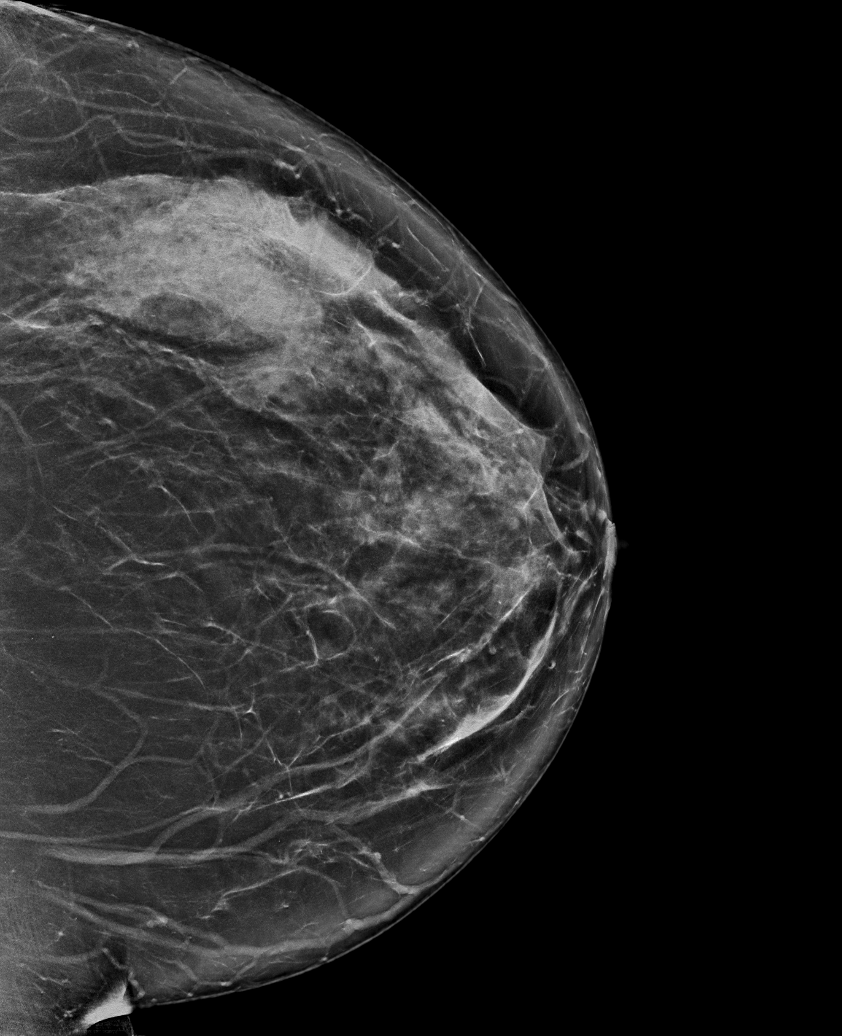

[R MLO synth-2D (2 of 2)]
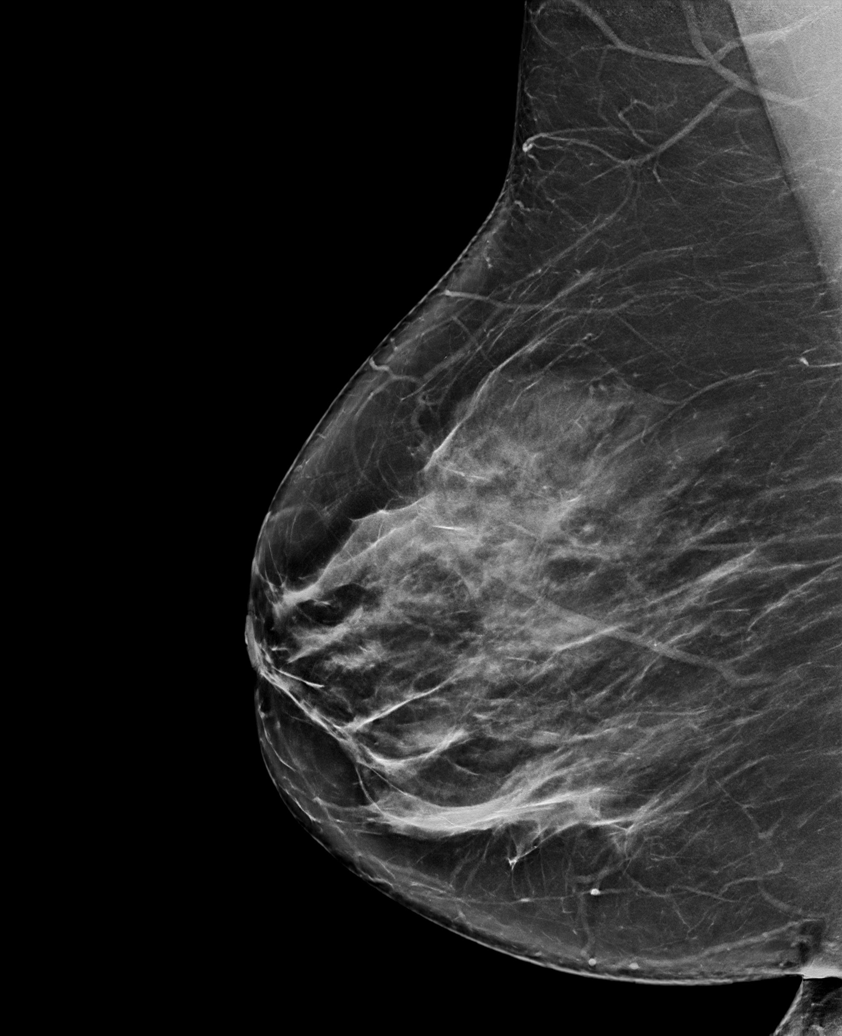

[6 of 36 positions shown; findings below may reference images not displayed]

ACR Breast Density Category c: The breast tissue is heterogeneously
dense, which may obscure small masses
FINDINGS: There are no findings suspicious for malignancy. The images were
evaluated with computer-aided detection.
IMPRESSION: No mammographic evidence of malignancy. A result letter of this
screening mammogram will be mailed directly to the patient.

RECOMMENDATION:
Screening mammogram in one year. (Code:CQ-1-UDH)

BI-RADS CATEGORY  1: Negative.

## 2022-02-21 DIAGNOSIS — J029 Acute pharyngitis, unspecified: Secondary | ICD-10-CM | POA: Diagnosis not present

## 2022-02-21 DIAGNOSIS — J069 Acute upper respiratory infection, unspecified: Secondary | ICD-10-CM | POA: Diagnosis not present

## 2022-02-21 DIAGNOSIS — R059 Cough, unspecified: Secondary | ICD-10-CM | POA: Diagnosis not present

## 2022-04-24 DIAGNOSIS — M79606 Pain in leg, unspecified: Secondary | ICD-10-CM | POA: Diagnosis not present

## 2022-04-24 DIAGNOSIS — Z1389 Encounter for screening for other disorder: Secondary | ICD-10-CM | POA: Diagnosis not present

## 2022-10-08 DIAGNOSIS — R52 Pain, unspecified: Secondary | ICD-10-CM | POA: Diagnosis not present

## 2022-10-08 DIAGNOSIS — R5383 Other fatigue: Secondary | ICD-10-CM | POA: Diagnosis not present

## 2022-10-16 ENCOUNTER — Encounter: Payer: Self-pay | Admitting: Physician Assistant

## 2022-10-16 ENCOUNTER — Ambulatory Visit (INDEPENDENT_AMBULATORY_CARE_PROVIDER_SITE_OTHER): Payer: Medicaid Other | Admitting: Physician Assistant

## 2022-10-16 DIAGNOSIS — Z716 Tobacco abuse counseling: Secondary | ICD-10-CM

## 2022-10-16 DIAGNOSIS — G4709 Other insomnia: Secondary | ICD-10-CM | POA: Diagnosis not present

## 2022-10-16 DIAGNOSIS — Z23 Encounter for immunization: Secondary | ICD-10-CM

## 2022-10-16 DIAGNOSIS — Z7689 Persons encountering health services in other specified circumstances: Secondary | ICD-10-CM

## 2022-10-16 DIAGNOSIS — G4739 Other sleep apnea: Secondary | ICD-10-CM | POA: Diagnosis not present

## 2022-10-16 DIAGNOSIS — Z862 Personal history of diseases of the blood and blood-forming organs and certain disorders involving the immune mechanism: Secondary | ICD-10-CM | POA: Diagnosis not present

## 2022-10-16 DIAGNOSIS — R739 Hyperglycemia, unspecified: Secondary | ICD-10-CM | POA: Diagnosis not present

## 2022-10-16 DIAGNOSIS — R011 Cardiac murmur, unspecified: Secondary | ICD-10-CM

## 2022-10-16 DIAGNOSIS — R252 Cramp and spasm: Secondary | ICD-10-CM

## 2022-10-16 DIAGNOSIS — K219 Gastro-esophageal reflux disease without esophagitis: Secondary | ICD-10-CM

## 2022-10-16 DIAGNOSIS — F339 Major depressive disorder, recurrent, unspecified: Secondary | ICD-10-CM | POA: Diagnosis not present

## 2022-10-16 MED ORDER — VARENICLINE TARTRATE (STARTER) 0.5 MG X 11 & 1 MG X 42 PO TBPK: ORAL_TABLET | ORAL | 0 refills | Status: DC

## 2022-10-16 NOTE — Progress Notes (Signed)
New patient visit   Patient: Sabrina Fuentes   DOB: Apr 07, 1971   52 y.o. Female  MRN: [id number] Visit Date: 10/16/2022  Today's healthcare provider: Mardene Speak, PA-C   CC: establish care,   Subjective    Sabrina Fuentes is a 52 y.o. female who presents today as a new patient to establish care.  HPI   Pt has been a pt at [facility] but was dissatisfied with their services during the covid-19 pandemic. She decided to change PCP Last was seen by her PCP 8 mo ago. Last labs were done 2 years ago Vaccinations status unclear  Ready to do a flu vaccine today for maintenance PAP smear was done 4 years  ago, has 5 children, over 80 yo - 12 , 28 yo with special needs - the youngest.  Patient would like to discuss weight gain due to sedentary lifestyle and working third shift   Current day smoker, 25 years, 1 ppd, made a few attempts to quit. Tried nicotine  patches without relief and quit after developing a local allergic reaction   Heart murmur Patient states that she was told by her last PCP that she has a heart murmur and she was unaware of ever having one.  Insomnia Has been suffering for some time, tried to use melatonin without any relief. Would like to have a medication for insomnia  Depression Flowsheet Row Office Visit from 10/16/2022 in [facility]  Thoughts that you would be better off dead, or of hurting yourself in some way Not at all  PHQ-9 Total Score 10      Sleep apnea   Her ESS was 10 today Endorses having: Excessive daytime sleepiness. Loud snoring. Trouble focusing during the day. cannot sleep properly, feels unrested, works thrid shift at home/ for [employer]  Acid reflex "Takes OTC omeprazole and heartburn subsides usually"  Hx of clotting diorder , during childbirth , she took a blood thinner for 6 weeks. Last time it was 7 years ago. Not taking blood thinners now.  She also complains of leg pain only when  lying down.  She states that she does not have it when she walks or is standing.    Past Medical History:  Diagnosis Date   Abdominal hernia    Allergy    Clotting disorder (HCC)    GERD (gastroesophageal reflux disease)    Heart murmur    Sleep apnea    Past Surgical History:  Procedure Laterality Date   TUBAL LIGATION     Family Status  Relation Name Status   Father  (Not Specified)   Mat Aunt  (Not Specified)   Ethlyn Daniels  (Not Specified)   Annamarie Major  (Not Specified)   MGM  (Not Specified)   PGM  (Not Specified)   Family History  Problem Relation Age of Onset   Diabetes Father    Diabetes Maternal Aunt    Diabetes Paternal Aunt    Diabetes Paternal Uncle    Arthritis Maternal Grandmother    Diabetes Paternal Grandmother    Social History   Socioeconomic History   Marital status: Single    Spouse name: Not on file   Number of children: Not on file   Years of education: Not on file   Highest education level: Not on file  Occupational History   Not on file  Tobacco Use   Smoking status: Every Day    Packs/day: 1.00  Types: Cigarettes   Smokeless tobacco: Never  Vaping Use   Vaping Use: Never used  Substance and Sexual Activity   Alcohol use: No   Drug use: No   Sexual activity: Yes  Other Topics Concern   Not on file  Social History Narrative   Not on file   Social Determinants of Health   Financial Resource Strain: Not on file  Food Insecurity: No Food Insecurity (10/16/2022)   Hunger Vital Sign    Worried About Running Out of Food in the Last Year: Never true    Ran Out of Food in the Last Year: Never true  Transportation Needs: Unmet Transportation Needs (10/16/2022)   PRAPARE - Hydrologist (Medical): Yes    Lack of Transportation (Non-Medical): Yes  Physical Activity: Not on file  Stress: Not on file  Social Connections: Not on file   Outpatient Medications Prior to Visit  Medication Sig   [DISCONTINUED]  clindamycin (CLEOCIN) 300 MG capsule Take 1 capsule (300 mg total) by mouth 4 (four) times daily.   [DISCONTINUED] doxycycline (VIBRAMYCIN) 100 MG capsule Take 1 capsule (100 mg total) by mouth 2 (two) times daily.   [DISCONTINUED] fluticasone (FLONASE) 50 MCG/ACT nasal spray Place 2 sprays into both nostrils daily. (Patient not taking: Reported on 07/08/2017)   [DISCONTINUED] HYDROcodone-acetaminophen (NORCO/VICODIN) 5-325 MG tablet Take 1 tablet by mouth every 4 (four) hours as needed for moderate pain.   [DISCONTINUED] magic mouthwash w/lidocaine SOLN Take 5 mLs by mouth 4 (four) times daily.   No facility-administered medications prior to visit.   Allergies  Allergen Reactions   Aspirin    Iodinated Contrast Media Itching   Penicillins Hives    Pt allergic to all "cillins" >Has patient had a PCN reaction causing immediate rash, facial/tongue/throat swelling, SOB or lightheadedness with hypotension: Yes Has patient had a PCN reaction causing severe rash involving mucus membranes or skin necrosis: patient is not sure  Has patient had a PCN reaction that required hospitalization patient is not sure Has patient had a PCN reaction occurring within the last 10 years: No If all of the above answers are "NO", then may proceed wit     There is no immunization history on file for this patient.  Health Maintenance  Topic Date Due   COVID-19 Vaccine (1) Never done   HIV Screening  Never done   Hepatitis C Screening  Never done   DTaP/Tdap/Td (1 - Tdap) Never done   PAP SMEAR-Modifier  Never done   COLONOSCOPY (Pts 45-46yr Insurance coverage will need to be confirmed)  Never done   INFLUENZA VACCINE  Never done   HPV VACCINES  Aged Out    Patient Care Team: OMardene Speak PA-C as PCP - General (Physician Assistant)  Review of Systems  Constitutional: Negative.   HENT: Negative.    Eyes: Negative.   Respiratory: Negative.    Cardiovascular: Negative.   Gastrointestinal: Negative.    Endocrine: Negative.   Genitourinary: Negative.   Musculoskeletal:  Positive for back pain, joint swelling and myalgias.  Skin: Negative.   Allergic/Immunologic: Negative.   Neurological:  Positive for headaches.  Hematological: Negative.   Psychiatric/Behavioral:  Positive for sleep disturbance.        Objective    BP (!) 130/56 (BP Location: Left Wrist, Patient Position: Sitting, Cuff Size: Large)   Pulse 82   Temp 97.8 F (36.6 C) (Oral)   Ht '5\' 4"'$  (1.626 m)   Wt 291 lb (  132 kg)   SpO2 98%   BMI 49.95 kg/m    Physical Exam Vitals reviewed.  Constitutional:      General: She is not in acute distress.    Appearance: Normal appearance. She is well-developed. She is not diaphoretic.  HENT:     Head: Normocephalic and atraumatic.  Eyes:     General: No scleral icterus.    Conjunctiva/sclera: Conjunctivae normal.  Neck:     Thyroid: No thyromegaly.  Cardiovascular:     Rate and Rhythm: Normal rate and regular rhythm.     Pulses: Normal pulses.     Heart sounds: Murmur heard.  Pulmonary:     Effort: Pulmonary effort is normal. No respiratory distress.     Breath sounds: Normal breath sounds. No wheezing, rhonchi or rales.  Musculoskeletal:     Cervical back: Neck supple.     Right lower leg: No edema.     Left lower leg: No edema.  Lymphadenopathy:     Cervical: No cervical adenopathy.  Skin:    General: Skin is warm and dry.     Findings: No rash.  Neurological:     Mental Status: She is alert and oriented to person, place, and time. Mental status is at baseline.  Psychiatric:        Mood and Affect: Mood normal.        Behavior: Behavior normal.        Thought Content: Thought content normal.        Judgment: Judgment normal.     Depression Screen    10/16/2022    9:16 AM  PHQ 2/9 Scores  PHQ - 2 Score 1  PHQ- 9 Score 10   No results found for any visits on 10/16/22.  Assessment & Plan     1. Morbid obesity (HCC) Chronic and BMI 49.95  today Pt needs to proceed with strict low-carb, low-fat diet and daily exercise. Weight loss of 5% of pt's current weight via healthy diet and daily exercise encouraged.  - CBC with Differential/Platelet - Comprehensive metabolic panel - Lipid panel - Hemoglobin A1c - TSH Will reassess after receiving lab results  2. Encounter for smoking cessation counseling Smoking-chronic problem Current day smoker for the past 25 years, up to [place] a few attempts to stop smoking. Last was a year ago for 3 months Patient is willing to quit smoking and start chantix: - Varenicline Tartrate, Starter, (CHANTIX STARTING MONTH PAK) 0.5 MG X 11 & 1 MG X 42 TBPK; Day 1-3: 0.5 mg once daily, Days 4 to 7: 0.5 mg BID ; day 8 and later: 1 mg BID  Dispense: 53 each; Refill: 0 Pt was explained that after starting pack , cont-ed needs to be Rx Pt needs to establsih a quit date  Will reassess at the next appt   3. Encounter to establish care Welcome to our clinic  Reviewed patient's Family Medical History, social hx, personal hx Reviewed and updated list of patient's medical providers Assessed patient's functional ability Established a written schedule for health screening services   4. GERD without esophagitis Continue PPI OTC Elevate the head of the bed 6-8 inches, avoid recumbency for 3 hours after eating, avoid food , advised weight loss   Will fu  5. Heart murmur 6. History of blood clotting disorder Not on blood thinners Chronic problem asymptomatic Amb ref to cardiology Will reassess  7. Other insomnia Could be related to depression vs sleep apnea vs primary Will update  labs Will send for sleep studies Will send to [facility] if pt is willing  8. Depression, recurrent ([place]) Chronic problem [place] [facility] Visit from 10/16/2022 in [place]  PHQ-9 Total Score 10      Moderate depression Amb ref to bh will be placed when pt agrees She prefers to start  with  sleep studies, see below. Advised counseling but pt declined Will fu  9. Other sleep apnea Chronic problem Symptomatic, ESS score 10 Referral for sleep studies was sent Will Fu  10. Leg cramps Unclear etiology Labs ordered Continue symptomatic treatment with hot/warm bath and Nsaids/ with meals Will reassess  Return in about 6 weeks (around 11/27/2022) for chronic disease f/u.     The patient was advised to call back or seek an in-person evaluation if the symptoms worsen or if the condition fails to improve as anticipated.  I discussed the assessment and treatment plan with the patient. The patient was provided an opportunity to ask questions and all were answered. The patient agreed with the plan and demonstrated an understanding of the instructions.  I, Mardene Speak, PA-C have reviewed all documentation for this visit. The documentation on 10/16/22 for the exam, diagnosis, procedures, and orders are all accurate and complete.  Mardene Speak, [facility], [place] [phone] (phone) [phone] (fax) [place]

## 2022-10-17 LAB — LIPID PANEL
Chol/HDL Ratio: 5 ratio — ABNORMAL HIGH (ref 0.0–4.4)
Cholesterol, Total: 232 mg/dL — ABNORMAL HIGH (ref 100–199)
HDL: 46 mg/dL (ref >39)
LDL Chol Calc (NIH): 158 mg/dL — ABNORMAL HIGH (ref 0–99)
Triglycerides: 155 mg/dL — ABNORMAL HIGH (ref 0–149)
VLDL Cholesterol Cal: 28 mg/dL (ref 5–40)

## 2022-10-17 LAB — COMPREHENSIVE METABOLIC PANEL
ALT: 17 IU/L (ref 0–32)
AST: 12 IU/L (ref 0–40)
Albumin/Globulin Ratio: 1.9 (ref 1.2–2.2)
Albumin: 4.4 g/dL (ref 3.9–4.9)
Alkaline Phosphatase: 62 IU/L (ref 44–121)
BUN/Creatinine Ratio: 14 (ref 9–23)
BUN: 12 mg/dL (ref 6–24)
Bilirubin Total: 0.3 mg/dL (ref 0.0–1.2)
CO2: 23 mmol/L (ref 20–29)
Calcium: 9.6 mg/dL (ref 8.7–10.2)
Chloride: 101 mmol/L (ref 96–106)
Creatinine, Ser: 0.87 mg/dL (ref 0.57–1.00)
Globulin, Total: 2.3 g/dL (ref 1.5–4.5)
Glucose: 98 mg/dL (ref 70–99)
Potassium: 5.2 mmol/L (ref 3.5–5.2)
Sodium: 138 mmol/L (ref 134–144)
Total Protein: 6.7 g/dL (ref 6.0–8.5)
eGFR: 82 mL/min/{1.73_m2} (ref >59)

## 2022-10-17 LAB — TSH: TSH: 2.26 u[IU]/mL (ref 0.450–4.500)

## 2022-10-17 LAB — CBC WITH DIFFERENTIAL/PLATELET
Basophils Absolute: 0.1 10*3/uL (ref 0.0–0.2)
Basos: 1 %
EOS (ABSOLUTE): 0.2 10*3/uL (ref 0.0–0.4)
Eos: 2 %
Hematocrit: 44.9 % (ref 34.0–46.6)
Hemoglobin: 15 g/dL (ref 11.1–15.9)
Immature Grans (Abs): 0 10*3/uL (ref 0.0–0.1)
Immature Granulocytes: 0 %
Lymphocytes Absolute: 2.4 10*3/uL (ref 0.7–3.1)
Lymphs: 29 %
MCH: 30.7 pg (ref 26.6–33.0)
MCHC: 33.4 g/dL (ref 31.5–35.7)
MCV: 92 fL (ref 79–97)
Monocytes Absolute: 0.7 10*3/uL (ref 0.1–0.9)
Monocytes: 8 %
Neutrophils Absolute: 4.9 10*3/uL (ref 1.4–7.0)
Neutrophils: 60 %
Platelets: 383 10*3/uL (ref 150–450)
RBC: 4.88 x10E6/uL (ref 3.77–5.28)
RDW: 12.8 % (ref 11.7–15.4)
WBC: 8.2 10*3/uL (ref 3.4–10.8)

## 2022-10-17 LAB — HEMOGLOBIN A1C
Est. average glucose Bld gHb Est-mCnc: 117 mg/dL
Hgb A1c MFr Bld: 5.7 % — ABNORMAL HIGH (ref 4.8–5.6)

## 2022-10-18 ENCOUNTER — Encounter: Payer: Self-pay | Admitting: Physician Assistant

## 2022-10-18 DIAGNOSIS — Z716 Tobacco abuse counseling: Secondary | ICD-10-CM | POA: Insufficient documentation

## 2022-10-18 DIAGNOSIS — Z862 Personal history of diseases of the blood and blood-forming organs and certain disorders involving the immune mechanism: Secondary | ICD-10-CM | POA: Insufficient documentation

## 2022-10-18 DIAGNOSIS — K219 Gastro-esophageal reflux disease without esophagitis: Secondary | ICD-10-CM | POA: Insufficient documentation

## 2022-10-18 DIAGNOSIS — R252 Cramp and spasm: Secondary | ICD-10-CM | POA: Insufficient documentation

## 2022-10-18 DIAGNOSIS — Z7689 Persons encountering health services in other specified circumstances: Secondary | ICD-10-CM | POA: Insufficient documentation

## 2022-10-18 DIAGNOSIS — G4739 Other sleep apnea: Secondary | ICD-10-CM | POA: Insufficient documentation

## 2022-10-18 DIAGNOSIS — G4709 Other insomnia: Secondary | ICD-10-CM | POA: Insufficient documentation

## 2022-10-18 DIAGNOSIS — Z23 Encounter for immunization: Secondary | ICD-10-CM | POA: Insufficient documentation

## 2022-10-18 DIAGNOSIS — F339 Major depressive disorder, recurrent, unspecified: Secondary | ICD-10-CM | POA: Insufficient documentation

## 2022-10-18 DIAGNOSIS — R011 Cardiac murmur, unspecified: Secondary | ICD-10-CM | POA: Insufficient documentation

## 2022-10-18 NOTE — Progress Notes (Signed)
Please, let pt know that her lipids elevated, The 10-year ASCVD risk score (risk heart attack and stoke) is: 5.3% ,  her A1c in prediabetic range, there rest of labs are WNL. Advised weight loss, healthy low carb, low fat diet and daily exercise

## 2022-10-23 ENCOUNTER — Encounter: Payer: Self-pay | Admitting: Physician Assistant

## 2022-10-26 ENCOUNTER — Ambulatory Visit
Admission: EM | Admit: 2022-10-26 | Discharge: 2022-10-26 | Disposition: A | Discharge status: Home / Self Care | Payer: Medicaid Other | Attending: Urgent Care | Admitting: Urgent Care

## 2022-10-26 ENCOUNTER — Encounter: Payer: Self-pay | Admitting: Emergency Medicine

## 2022-10-26 DIAGNOSIS — U071 COVID-19: Secondary | ICD-10-CM | POA: Diagnosis not present

## 2022-10-26 DIAGNOSIS — R6889 Other general symptoms and signs: Secondary | ICD-10-CM

## 2022-10-26 MED ORDER — HYDROCOD POLI-CHLORPHE POLI ER 10-8 MG/5ML PO SUER: 5.0000 mL | Freq: Two times a day (BID) | ORAL | 0 refills | Status: AC | PRN

## 2022-10-26 MED ORDER — OSELTAMIVIR PHOSPHATE 75 MG PO CAPS: 75.0000 mg | ORAL_CAPSULE | Freq: Two times a day (BID) | ORAL | 0 refills | Status: DC

## 2022-10-26 MED ORDER — BENZONATATE 100 MG PO CAPS: ORAL_CAPSULE | ORAL | 0 refills | Status: DC

## 2022-10-26 NOTE — ED Triage Notes (Addendum)
Symptoms on-going since Thursday. Started with runny nose, headache, progressed into generalized body aches, diarrhea, abdominal discomfort, coughing mostly at night when lying down, mild SOB, and chest tightness "like it's on fire." Denies fever, leg swelling, hx of heart problems aside from a known murmur, nausea, vomiting, ear pain, wheezing. Is an every day cigarrette smoker, currently on chantix smoking 6/day. PMH of PE related to hormonal birth control use.

## 2022-10-26 NOTE — Discharge Instructions (Addendum)
You have been diagnosed with a viral upper respiratory infection based on your symptoms and exam. Viral illnesses cannot be treated with antibiotics - they are self limiting - and you should find your symptoms resolving within a few days. Get plenty of rest and non-caffeinated fluids. Watch for signs of dehydration including reduced urine output and dark colored urine.  We have performed a respiratory swab testing for COVID. I have prescribed Tamiflu, antiviral therapy for influenza A, based on a presumptive diagnosis of influenza.  Someone will contact you after results of your swab are available with instructions to continue or stop this medication. If the results of this testing are positive, someone will call you if you are eligible for any antiviral treatment.    We recommend you use over-the-counter medications for symptom control including acetaminophen (Tylenol), ibuprofen (Advil/Motrin) or naproxen (Aleve) for throat pain, fever, chills or body aches. You may combine use of acetaminophen and ibuprofen/naproxen if needed.  Some patients find an pain-relieving throat spray such as Chloraseptic to be effective.  Also recommend cold/cough medication containing a cough suppressant such as dextromethorphan, as needed. Please note that some cough medications are not recommended if you suffer from hypertension.    Saline mist spray is helpful for removing excess mucus from your nose.  Room humidifiers are helpful to ease breathing at night. I recommend guaifenesin (Mucinex) with plenty of water throughout the day to help thin and loosen mucus secretions in your respiratory passages.   If appropriate based upon your other medical problems, you might also find relief of nasal/sinus congestion symptoms by using a nasal decongestant such as fluticasone (Flonase ) or pseudoephedrine (Sudafed sinus).  You will need to obtain Sudafed from behind the pharmacist counter.  Speak to the pharmacist to verify that you  are not duplicating medications with other over-the-counter formulations that you may be using.

## 2022-10-26 NOTE — ED Provider Notes (Signed)
Roderic Palau    CSN: [id number] Arrival date & time: 10/26/22  0801      History   Chief Complaint Chief Complaint  Patient presents with   Nasal Congestion    HPI Sabrina Fuentes is a 52 y.o. female.   HPI  Presents with c/o symptoms x 2 days. Endorses rhinorrhea, HA, myalgias, diarrhea, abdominal discomfort, coughing, SOB, chest tightness.  Reports cough is worse at night and when reclined. Endorses chest feels "like its on fire". Characterizes SOB as "mild".  Denies fever, nausea, vomiting, ear pain, wheezing.  Every day cigarette smoker, now 6/day using chantix.  PMH includes heart murmur (asymptomatic), GERD, PE 2/2 hormonal birth control.  Past Medical History:  Diagnosis Date   Abdominal hernia    Allergy    Clotting disorder (HCC)    GERD (gastroesophageal reflux disease)    Heart murmur    Sleep apnea     Patient Active Problem List   Diagnosis Date Noted   Morbid obesity ([place]) 10/18/2022   Encounter for smoking cessation counseling 10/18/2022   Encounter to establish care 10/18/2022   GERD without esophagitis 10/18/2022   Other insomnia 10/18/2022   History of blood clotting disorder 10/18/2022   Heart murmur 10/18/2022   Depression, recurrent ([place]) 10/18/2022   Other sleep apnea 10/18/2022   Leg cramps 10/18/2022   Need for influenza vaccination 10/18/2022    Past Surgical History:  Procedure Laterality Date   TUBAL LIGATION      OB History   No obstetric history on file.      Home Medications    Prior to Admission medications   Medication Sig Start Date End Date Taking? Authorizing Provider  Varenicline Tartrate, Starter, (CHANTIX STARTING MONTH PAK) 0.5 MG X 11 & 1 MG X 42 TBPK Day 1-3: 0.5 mg once daily, Days 4 to 7: 0.5 mg BID ; day 8 and later: 1 mg BID 10/16/22   Mardene Speak, PA-C    Family History Family History  Problem Relation Age of Onset   Diabetes Father    Diabetes Maternal Aunt    Diabetes Paternal Aunt     Diabetes Paternal Uncle    Arthritis Maternal Grandmother    Diabetes Paternal Grandmother     Social History Social History   Tobacco Use   Smoking status: Every Day    Packs/day: 1.00    Types: Cigarettes   Smokeless tobacco: Never  Vaping Use   Vaping Use: Never used  Substance Use Topics   Alcohol use: No   Drug use: No     Allergies   Aspirin, Iodinated contrast media, and Penicillins   Review of Systems Review of Systems   Physical Exam Triage Vital Signs ED Triage Vitals [10/26/22 0809]  Enc Vitals Group     BP (!) 140/69     Pulse Rate 82     Resp 16     Temp 98.1 F (36.7 C)     Temp Source Oral     SpO2 97 %     Weight      Height      Head Circumference      Peak Flow      Pain Score      Pain Loc      Pain Edu?      Excl. in [place]?    No data found.  Updated Vital Signs BP (!) 140/69 (BP Location: Left Arm)   Pulse 82   Temp 98.1  F (36.7 C) (Oral)   Resp 16   SpO2 97%   Visual Acuity Right Eye Distance:   Left Eye Distance:   Bilateral Distance:    Right Eye Near:   Left Eye Near:    Bilateral Near:     Physical Exam Vitals reviewed.  Constitutional:      Appearance: Normal appearance. She is ill-appearing.  Cardiovascular:     Rate and Rhythm: Normal rate and regular rhythm.     Heart sounds: Murmur heard.     Comments: Systolic murmur Pulmonary:     Effort: Pulmonary effort is normal.     Breath sounds: Normal breath sounds.  Skin:    General: Skin is warm and dry.  Neurological:     General: No focal deficit present.     Mental Status: She is alert and oriented to person, place, and time.  Psychiatric:        Mood and Affect: Mood normal.        Behavior: Behavior normal.      UC Treatments / Results  Labs (all labs ordered are listed, but only abnormal results are displayed) Labs Reviewed - No data to display  EKG   Radiology No results found.  Procedures Procedures (including critical care  time)  Medications Ordered in UC Medications - No data to display  Initial Impression / Assessment and Plan / UC Course  I have reviewed the triage vital signs and the nursing notes.  Pertinent labs & imaging results that were available during my care of the patient were reviewed by me and considered in my medical decision making (see chart for details).   Patient is afebrile here without recent antipyretics. Satting well on room air. Overall is well appearing, well hydrated, without respiratory distress. Pulmonary exam is unremarkable.  Lungs CTAB without wheezing, rhonchi, rales.  Symptoms are consistent with an acute viral process including flu and COVID.  Given she is still within the treatment window for influenza, will provide antiviral therapy whilst awaiting for results of COVID swab.  Benzonatate and Tussionex are provided given OTC medications have not adequately controlled her symptoms.  Otherwise recommending continued use of OTC medication including NSAIDs for better relief of myalgias.  Final Clinical Impressions(s) / UC Diagnoses   Final diagnoses:  None   Discharge Instructions   None    ED Prescriptions   None    PDMP not reviewed this encounter.   Rose Phi, [place] 10/26/22 [phone]

## 2022-10-27 LAB — SARS CORONAVIRUS 2 (TAT 6-24 HRS): SARS Coronavirus 2: POSITIVE — AB

## 2022-10-28 NOTE — Telephone Encounter (Unsigned)
Copied from [facility] [id number]. Topic: General - Other >> Oct 28, 2022 10:26 AM Oley Balm E wrote: Reason for CRM: Pt called reporting that she has been to the [facility] urgent care in [place], she has tested positive for covid and her work will not accept the letter they gave her from the [facility]. She needs a doctor's note with a date detailing when she can return to work. She still has a fever, and is not sure exactly which date will work best.   Geraldine Solar to discuss with a nurse   Best contact: [phone]

## 2022-10-29 ENCOUNTER — Encounter: Payer: Self-pay | Admitting: Physician Assistant

## 2022-10-29 ENCOUNTER — Telehealth (INDEPENDENT_AMBULATORY_CARE_PROVIDER_SITE_OTHER): Payer: Medicaid Other | Admitting: Physician Assistant

## 2022-10-29 DIAGNOSIS — U071 COVID-19: Secondary | ICD-10-CM

## 2022-10-29 MED ORDER — NIRMATRELVIR/RITONAVIR (PAXLOVID)TABLET: 3.0000 | ORAL_TABLET | Freq: Two times a day (BID) | ORAL | 0 refills | Status: AC

## 2022-10-29 NOTE — Progress Notes (Unsigned)
MyChart Video Visit    Virtual Visit via Video Note   This format is felt to be most appropriate for this patient at this time. Physical exam was limited by quality of the video and audio technology used for the visit.   Patient location: home  Provider location: office  I discussed the limitations of evaluation and management by telemedicine and the availability of in person appointments. The patient expressed understanding and agreed to proceed.  Patient: Sabrina Fuentes   DOB: 1971-05-02   52 y.o. Female  MRN: [id number] Visit Date: 10/29/2022  Today's healthcare provider: Mardene Speak, PA-C   CC:  Tested positive for COVID  Subjective    HPI  Patient is a 52 year old female who presents for sleep disturbance.  She states that she had been previously set up for a sleep study but the facility was not on her insurance.  She needs to have new order placed for sleep study at [facility] facility.  Covid-patient started having symptoms on 10/24/22.  She was tested on 10/26/22 and tested positive for Covid.  The facility did not have any flu swabs and therefor she was not tested for flu but they went ahead and treated her for it with Tamiflu.  Patient also needs to have note for work from 10/25/22-11/05/22   Medications: Outpatient Medications Prior to Visit  Medication Sig   benzonatate (TESSALON) 100 MG capsule Take 1-2 tablets 3 times a day as needed for cough   chlorpheniramine-HYDROcodone (TUSSIONEX) 10-8 MG/5ML Take 5 mLs by mouth every 12 (twelve) hours as needed for up to 7 days for cough. This medication is sedating. Do not drive or operate machinery while taking.   oseltamivir (TAMIFLU) 75 MG capsule Take 1 capsule (75 mg total) by mouth every 12 (twelve) hours.   Varenicline Tartrate, Starter, (CHANTIX STARTING MONTH PAK) 0.5 MG X 11 & 1 MG X 42 TBPK Day 1-3: 0.5 mg once daily, Days 4 to 7: 0.5 mg BID ; day 8 and later: 1 mg BID   No facility-administered medications prior to  visit.    Review of Systems  Constitutional:  Positive for chills, diaphoresis, fatigue and fever. Negative for appetite change.  HENT:  Positive for congestion, sneezing, sore throat and voice change. Negative for ear discharge, ear pain, hearing loss, postnasal drip, rhinorrhea, sinus pressure, sinus pain, tinnitus and trouble swallowing.   Eyes:  Negative for photophobia, pain, discharge, redness, itching and visual disturbance.  Respiratory:  Positive for cough, chest tightness and shortness of breath. Negative for wheezing.   Gastrointestinal:  Negative for abdominal pain, constipation, diarrhea, nausea and vomiting.  Musculoskeletal:  Positive for myalgias.  Neurological:  Positive for dizziness, light-headedness and headaches.    {Labs  Heme  Chem  Endocrine  Serology  Results Review (optional):23779}   Objective    There were no vitals taken for this visit.  {Show previous vital signs (optional):23777}   Physical Exam Constitutional:      General: She is in acute distress.     Appearance: Normal appearance. She is well-developed.  HENT:     Head: Normocephalic and atraumatic.  Neck:     Thyroid: No thyromegaly.  Pulmonary:     Effort: Pulmonary effort is normal.  Musculoskeletal:        General: Normal range of motion.     Cervical back: Normal range of motion.  Neurological:     Mental Status: She is alert and oriented to person, place,  and time.  Psychiatric:        Behavior: Behavior normal.        Thought Content: Thought content normal.        Judgment: Judgment normal.        Assessment & Plan     1. COVID-19  tested positive for COVID-19 - new problem - discussed return precautions  - within the antiviral window as symptoms present < 5 days - discussed masking/isolation  - nirmatrelvir/ritonavir EUA,  (PAXLOVID) 20 x 150 MG & 10 x '100MG'$  TABS;  Take 3 tablets by mouth 2 (two) times daily for 5 days. (Take nirmatrelvir 150 mg two tablets twice  daily for 5 days and ritonavir 100 mg one tablet twice daily for 5 days) Patient GFR is 82     Counseled risk/benefits of medications. Expected course of illness. Potential rebound symptoms and potential complications and to call if sx worsen or not much better when finished with Paxlovid.     Return if symptoms worsen or fail to improve.     No follow-ups on file.     I discussed the assessment and treatment plan with the patient. The patient was provided an opportunity to ask questions and all were answered. The patient agreed with the plan and demonstrated an understanding of the instructions.   The patient was advised to call back or seek an in-person evaluation if the symptoms worsen or if the condition fails to improve as anticipated.  I provided 9 minutes of non-face-to-face time during this encounter.  The patient was advised to call back or seek an in-person evaluation if the symptoms worsen or if the condition fails to improve as anticipated.  I discussed the assessment and treatment plan with the patient. The patient was provided an opportunity to ask questions and all were answered. The patient agreed with the plan and demonstrated an understanding of the instructions.  I, Mardene Speak, PA-C have reviewed all documentation for this visit. The documentation on 10/29/22 for the exam, diagnosis, procedures, and orders are all accurate and complete.  Mardene Speak, [facility], [place] [phone] (phone) [phone] (fax)   [place]

## 2022-10-31 ENCOUNTER — Ambulatory Visit: Payer: Medicaid Other | Admitting: Physician Assistant

## 2022-10-31 ENCOUNTER — Encounter: Payer: Self-pay | Admitting: Physician Assistant

## 2022-11-12 ENCOUNTER — Ambulatory Visit: Payer: Medicaid Other | Admitting: Physician Assistant

## 2022-11-12 NOTE — Progress Notes (Deleted)
      Established patient visit   Patient: Sabrina Fuentes   DOB: [birth year]   52 y.o. Female  MRN: [id number] Visit Date: 11/12/2022  Today's healthcare provider: Mardene Speak, PA-C   No chief complaint on file.  Subjective    HPI  ***  Medications: Outpatient Medications Prior to Visit  Medication Sig   benzonatate (TESSALON) 100 MG capsule Take 1-2 tablets 3 times a day as needed for cough   oseltamivir (TAMIFLU) 75 MG capsule Take 1 capsule (75 mg total) by mouth every 12 (twelve) hours.   Varenicline Tartrate, Starter, (CHANTIX STARTING MONTH PAK) 0.5 MG X 11 & 1 MG X 42 TBPK Day 1-3: 0.5 mg once daily, Days 4 to 7: 0.5 mg BID ; day 8 and later: 1 mg BID   No facility-administered medications prior to visit.    Review of Systems  {Labs  Heme  Chem  Endocrine  Serology  Results Review (optional):23779}   Objective    There were no vitals taken for this visit. {Show previous vital signs (optional):23777}  Physical Exam  ***  No results found for any visits on 11/12/22.  Assessment & Plan     ***  No follow-ups on file.      The patient was advised to call back or seek an in-person evaluation if the symptoms worsen or if the condition fails to improve as anticipated.  I discussed the assessment and treatment plan with the patient. The patient was provided an opportunity to ask questions and all were answered. The patient agreed with the plan and demonstrated an understanding of the instructions.  I, Mardene Speak, PA-C have reviewed all documentation for this visit. The documentation on  11/12/22  for the exam, diagnosis, procedures, and orders are all accurate and complete.  Mardene Speak, [facility], [place] [phone] (phone) [phone] (fax)   [place]

## 2022-11-13 ENCOUNTER — Ambulatory Visit: Payer: Self-pay | Admitting: Physician Assistant

## 2022-11-27 ENCOUNTER — Ambulatory Visit (INDEPENDENT_AMBULATORY_CARE_PROVIDER_SITE_OTHER): Payer: Medicaid Other | Admitting: Physician Assistant

## 2022-11-27 VITALS — BP 131/89 | HR 76 | Temp 98.5°F | Wt 294.0 lb

## 2022-11-27 DIAGNOSIS — R7303 Prediabetes: Secondary | ICD-10-CM | POA: Diagnosis not present

## 2022-11-27 DIAGNOSIS — F17200 Nicotine dependence, unspecified, uncomplicated: Secondary | ICD-10-CM | POA: Diagnosis not present

## 2022-11-27 DIAGNOSIS — J302 Other seasonal allergic rhinitis: Secondary | ICD-10-CM

## 2022-11-27 DIAGNOSIS — F339 Major depressive disorder, recurrent, unspecified: Secondary | ICD-10-CM

## 2022-11-27 MED ORDER — BUPROPION HCL ER (XL) 150 MG PO TB24: 150.0000 mg | ORAL_TABLET | Freq: Every day | ORAL | 0 refills | Status: DC

## 2022-11-27 NOTE — Progress Notes (Signed)
Established patient visit   Patient: Sabrina Fuentes   DOB: 07-Apr-1971   52 y.o. Female  MRN: [id number] Visit Date: 11/27/2022  Today's healthcare provider: Mardene Speak, PA-C   CC: 6 week FU of chronic health  Subjective    HPI  Patient is a 52 year old female who presents for 6 week follow up of chronic health.  She was last seen on 10/16/22.  At that time she had referral for sleep study and it was recommended she be seen by cardiology. Will see Cardiology on April 22nd. She has appointment with cardiology in 2 weeks and the company that they set her sleep study with, does not take her insurance.  She will need new order for an in facility ([facility]) sleep study, not home sleep study.  Patient would like to discuss weight loss. She changed her current low-carb diet  Pt was rx ed chantix last month, she had to quit after 2 weeks because of COVID-19 infection. Currently, she reports smoking up to 10 cig per day. Pt reports that her work schedule will be changed back to dayshift from night shift. She seems very excited about this change as she believes that many of her current medical symptoms could be attributed to her work schedule.   Medications: Outpatient Medications Prior to Visit  Medication Sig   benzonatate (TESSALON) 100 MG capsule Take 1-2 tablets 3 times a day as needed for cough   oseltamivir (TAMIFLU) 75 MG capsule Take 1 capsule (75 mg total) by mouth every 12 (twelve) hours.   Varenicline Tartrate, Starter, (CHANTIX STARTING MONTH PAK) 0.5 MG X 11 & 1 MG X 42 TBPK Day 1-3: 0.5 mg once daily, Days 4 to 7: 0.5 mg BID ; day 8 and later: 1 mg BID   No facility-administered medications prior to visit.    Review of Systems  Respiratory:  Negative for cough, shortness of breath and wheezing.   Cardiovascular:  Negative for chest pain, palpitations and leg swelling.  Neurological:  Negative for headaches.       Objective    BP 131/89 (BP Location: Right Arm, Patient  Position: Sitting, Cuff Size: Large)   Pulse 76   Temp 98.5 F (36.9 C) (Oral)   Wt 294 lb (133.4 kg)   SpO2 98%   BMI 50.46 kg/m    Physical Exam Vitals reviewed.  Constitutional:      General: She is not in acute distress.    Appearance: Normal appearance. She is well-developed. She is not diaphoretic.  HENT:     Head: Normocephalic and atraumatic.     Right Ear: There is impacted cerumen.     Left Ear: Tympanic membrane, ear canal and external ear normal.     Nose: Congestion and rhinorrhea present.  Eyes:     General: No scleral icterus.    Extraocular Movements: Extraocular movements intact.     Conjunctiva/sclera: Conjunctivae normal.     Pupils: Pupils are equal, round, and reactive to light.  Neck:     Thyroid: No thyromegaly.  Cardiovascular:     Rate and Rhythm: Normal rate and regular rhythm.     Pulses: Normal pulses.     Heart sounds: Normal heart sounds. No murmur heard. Pulmonary:     Effort: Pulmonary effort is normal. No respiratory distress.     Breath sounds: Normal breath sounds. No wheezing, rhonchi or rales.  Musculoskeletal:     Cervical back: Neck supple.  Right lower leg: No edema.     Left lower leg: No edema.  Lymphadenopathy:     Cervical: No cervical adenopathy.  Skin:    General: Skin is warm and dry.     Findings: No rash.  Neurological:     Mental Status: She is alert and oriented to person, place, and time. Mental status is at baseline.  Psychiatric:        Behavior: Behavior normal.        Thought Content: Thought content normal.        Judgment: Judgment normal.      No results found for any visits on 11/27/22.  Assessment & Plan     1. Prediabetes New problem Was diagnosed on 09/2022, her A1C was 5.7 Advised weight loss via diet and daily exercise - Amb Ref to Medical Weight Management Will monitor  2. Morbid obesity Chronic and BMI 50.46 today Pt needs to proceed with strict low-carb, low-fat diet and daily  exercise. Weight loss of 5% of pt's current weight via healthy diet and daily exercise encouraged.  - Amb Ref to Medical Weight Management  A trial of Wellbutrin was scheduled for weigh control, depression and smoking cessation. - buPROPion (WELLBUTRIN XL) 150 MG 24 hr tablet; Take 1 tablet (150 mg total) by mouth daily.  Dispense: 30 tablet; Refill: 0  Discussed her lab results from 09/2022 with elevated cholesterol but The 10-year ASCVD risk score (Arnett DK, et al., 2019) is: 5.4% , statin is not required. Will reassess in a mo  3. Depression, recurrent Chronic PHQ9 score was 10, moderate depression - Amb Ref to Medical Weight Management - buPROPion (WELLBUTRIN XL) 150 MG 24 hr tablet; Take 1 tablet (150 mg total) by mouth daily.  Dispense: 30 tablet; Refill: 0 Will reassess in a month  4. Smoking Chronic  Improved on chantix, d/c chantix after having covid on 10/29/22 - buPROPion (WELLBUTRIN XL) 150 MG 24 hr tablet; Take 1 tablet (150 mg total) by mouth daily.  Dispense: 30 tablet; Refill: 0 - Ambulatory referral to Sleep Studies Will reassess  5. Seasonal allergies - Avoidance measures discussed. Advised the following symptomatic treatment  - Use nasal saline rinses before nose sprays such as with Neilmed Sinus Rinse bottle.  Use distilled water.   - Use Flonase OTC 2 sprays each nostril daily. Aim upward and outward. - Use Zyrtec OTC 10 mg daily.  Will FU  Return in about 4 weeks (around 12/25/2022) for CPE without PAP smear.  Advised to reestablished care with ObGyn    The patient was advised to call back or seek an in-person evaluation if the symptoms worsen or if the condition fails to improve as anticipated.  I discussed the assessment and treatment plan with the patient. The patient was provided an opportunity to ask questions and all were answered. The patient agreed with the plan and demonstrated an understanding of the instructions.  I, Mardene Speak, PA-C have reviewed  all documentation for this visit. The documentation on 11/27/22 for the exam, diagnosis, procedures, and orders are all accurate and complete.  Mardene Speak, [facility], [place] [phone] (phone) [phone] (fax)   [place]

## 2022-11-29 ENCOUNTER — Ambulatory Visit
Admission: RE | Admit: 2022-11-29 | Discharge: 2022-11-29 | Disposition: A | Discharge status: Home / Self Care | Payer: Medicaid Other | Source: Ambulatory Visit | Attending: Emergency Medicine | Admitting: Emergency Medicine

## 2022-11-29 ENCOUNTER — Ambulatory Visit
Admission: EM | Admit: 2022-11-29 | Discharge: 2022-11-29 | Disposition: A | Discharge status: Home / Self Care | Payer: Medicaid Other | Attending: Emergency Medicine | Admitting: Emergency Medicine

## 2022-11-29 DIAGNOSIS — L03115 Cellulitis of right lower limb: Secondary | ICD-10-CM

## 2022-11-29 DIAGNOSIS — M79661 Pain in right lower leg: Secondary | ICD-10-CM | POA: Diagnosis not present

## 2022-11-29 DIAGNOSIS — M7989 Other specified soft tissue disorders: Secondary | ICD-10-CM | POA: Insufficient documentation

## 2022-11-29 DIAGNOSIS — Z86711 Personal history of pulmonary embolism: Secondary | ICD-10-CM

## 2022-11-29 MED ORDER — DOXYCYCLINE HYCLATE 100 MG PO CAPS: 100.0000 mg | ORAL_CAPSULE | Freq: Two times a day (BID) | ORAL | 0 refills | Status: AC

## 2022-11-29 NOTE — ED Provider Notes (Signed)
[facility]    CSN: [id number] Arrival date & time: 11/29/22  0935      History   Chief Complaint Chief Complaint  Patient presents with   Leg Pain    HPI Sabrina Fuentes is a 52 y.o. female.  Patient presents with pain, swelling, redness of her right lower leg since 0300 this morning.  She got up during the night to use the bathroom and noted pain in her calf when she stood.  No falls or injury.  No wounds noted.  She denies fever, chest pain, shortness of breath, or other symptoms.  Her medical history includes clotting disorder, pulmonary embolism, morbid obesity.  Current everyday smoker.   The history is provided by the patient and medical records.    Past Medical History:  Diagnosis Date   Abdominal hernia    Allergy    Clotting disorder    GERD (gastroesophageal reflux disease)    Heart murmur    Sleep apnea     Patient Active Problem List   Diagnosis Date Noted   Morbid obesity 10/18/2022   Encounter for smoking cessation counseling 10/18/2022   Encounter to establish care 10/18/2022   GERD without esophagitis 10/18/2022   Other insomnia 10/18/2022   History of blood clotting disorder 10/18/2022   Heart murmur 10/18/2022   Depression, recurrent 10/18/2022   Other sleep apnea 10/18/2022   Leg cramps 10/18/2022   Need for influenza vaccination 10/18/2022    Past Surgical History:  Procedure Laterality Date   TUBAL LIGATION      OB History   No obstetric history on file.      Home Medications    Prior to Admission medications   Medication Sig Start Date End Date Taking? Authorizing Provider  doxycycline (VIBRAMYCIN) 100 MG capsule Take 1 capsule (100 mg total) by mouth 2 (two) times daily for 7 days. 11/29/22 12/06/22 Yes Mickie Bailate, Nikolette Reindl H, NP  benzonatate (TESSALON) 100 MG capsule Take 1-2 tablets 3 times a day as needed for cough Patient not taking: Reported on 11/29/2022 10/26/22   Immordino, Jeannett SeniorStephen, FNP  buPROPion (WELLBUTRIN XL) 150 MG 24 hr  tablet Take 1 tablet (150 mg total) by mouth daily. 11/27/22   Debera Latstwalt, Janna, PA-C  oseltamivir (TAMIFLU) 75 MG capsule Take 1 capsule (75 mg total) by mouth every 12 (twelve) hours. Patient not taking: Reported on 11/29/2022 10/26/22   Immordino, Jeannett SeniorStephen, FNP  Varenicline Tartrate, Starter, (CHANTIX STARTING MONTH PAK) 0.5 MG X 11 & 1 MG X 42 TBPK Day 1-3: 0.5 mg once daily, Days 4 to 7: 0.5 mg BID ; day 8 and later: 1 mg BID Patient not taking: Reported on 11/29/2022 10/16/22   Debera Latstwalt, Janna, PA-C    Family History Family History  Problem Relation Age of Onset   Diabetes Father    Arthritis Maternal Grandmother    Diabetes Paternal Grandmother    Diabetes Maternal Aunt    Diabetes Paternal Aunt    Diabetes Paternal Uncle     Social History Social History   Tobacco Use   Smoking status: Every Day    Packs/day: 1    Types: Cigarettes   Smokeless tobacco: Never  Vaping Use   Vaping Use: Never used  Substance Use Topics   Alcohol use: No   Drug use: No     Allergies   Aspirin, Iodinated contrast media, and Penicillins   Review of Systems Review of Systems  Constitutional:  Negative for chills and fever.  Respiratory:  Negative for cough and shortness of breath.   Cardiovascular:  Negative for chest pain and palpitations.  Musculoskeletal:  Positive for gait problem and myalgias.  Skin:  Positive for color change and rash.     Physical Exam Triage Vital Signs ED Triage Vitals  Enc Vitals Group     BP 11/29/22 0957 127/78     Pulse Rate 11/29/22 0951 88     Resp 11/29/22 0951 18     Temp 11/29/22 0951 97.7 F (36.5 C)     Temp src --      SpO2 11/29/22 0951 95 %     Weight --      Height --      Head Circumference --      Peak Flow --      Pain Score 11/29/22 0954 5     Pain Loc --      Pain Edu? --      Excl. in GC? --    No data found.  Updated Vital Signs BP 127/78   Pulse 88   Temp 97.7 F (36.5 C)   Resp 18   LMP 11/26/2022   SpO2 95%   Visual  Acuity Right Eye Distance:   Left Eye Distance:   Bilateral Distance:    Right Eye Near:   Left Eye Near:    Bilateral Near:     Physical Exam Constitutional:      General: She is not in acute distress.    Appearance: She is obese. She is not ill-appearing.  HENT:     Mouth/Throat:     Mouth: Mucous membranes are moist.  Cardiovascular:     Rate and Rhythm: Normal rate and regular rhythm.     Heart sounds: Normal heart sounds.  Pulmonary:     Effort: Pulmonary effort is normal. No respiratory distress.     Breath sounds: Normal breath sounds.  Musculoskeletal:        General: Swelling and tenderness present. No deformity. Normal range of motion.       Legs:  Skin:    Findings: Erythema present.  Neurological:     General: No focal deficit present.     Mental Status: She is alert and oriented to person, place, and time.     Sensory: No sensory deficit.     Motor: No weakness.     Gait: Gait normal.  Psychiatric:        Mood and Affect: Mood normal.        Behavior: Behavior normal.      UC Treatments / Results  Labs (all labs ordered are listed, but only abnormal results are displayed) Labs Reviewed - No data to display  EKG   Radiology US Venous Img Lower Unilateral Right (DVT)  Result Date: 11/29/2022 CLINICAL DATA:  Pain and swelling EXAM: RIGHT LOWER EXTREMITY VENOUS DOPPLER ULTRASOUND TECHNIQUE: Gray-scale sonography with graded compression, as well as color Doppler and duplex ultrasound were performed to evaluate the lower extremity deep venous systems from the level of the common femoral vein and including the common femoral, femoral, profunda femoral, popliteal and calf veins including the posterior tibial, peroneal and gastrocnemius veins when visible. Spectral Doppler was utilized to evaluate flow at rest and with distal augmentation maneuvers in the common femoral, femoral and popliteal veins. COMPARISON:  None Available. FINDINGS: Contralateral Common  Femoral Vein: Respiratory phasicity is normal and symmetric with the symptomatic side. No evidence of thrombus. Normal compressibility. Common Femoral Vein: No evidence  of thrombus. Normal compressibility, respiratory phasicity and response to augmentation. Saphenofemoral Junction: No evidence of thrombus. Normal compressibility and flow on color Doppler imaging. Profunda Femoral Vein: No evidence of thrombus. Normal compressibility and flow on color Doppler imaging. Femoral Vein: No evidence of thrombus. Normal compressibility, respiratory phasicity and response to augmentation. Popliteal Vein: No evidence of thrombus. Normal compressibility, respiratory phasicity and response to augmentation. Calf Veins: No evidence of thrombus. Normal compressibility and flow on color Doppler imaging. IMPRESSION: No evidence of deep venous thrombosis. Electronically Signed   By: Judie PetitM.  Shick M.D.   On: 11/29/2022 11:22    Procedures Procedures (including critical care time)  Medications Ordered in UC Medications - No data to display  Initial Impression / Assessment and Plan / UC Course  I have reviewed the triage vital signs and the nursing notes.  Pertinent labs & imaging results that were available during my care of the patient were reviewed by me and considered in my medical decision making (see chart for details).   Cellulitis of right lower leg;  Pain, swelling of right lower leg; History of pulmonary embolism.   Patient has history of clotting disorder; she had a pulmonary embolism in 2006.  She currently has no chest pain or shortness of breath.  She declines transfer to the ED.  Ultrasound shows no evidence of DVT.  Treating cellulitis with doxycycline.  Instructed patient to follow up with her PCP on Monday.  ED precautions given.  Education provided on cellulitis.  She agrees to plan of care.     Final Clinical Impressions(s) / UC Diagnoses   Final diagnoses:  Pain and swelling of lower leg, right   Cellulitis of right lower leg  History of pulmonary embolism     Discharge Instructions      Take the doxycycline as directed.  Follow up with your primary care provider on Monday.        ED Prescriptions     Medication Sig Dispense Auth. Provider   doxycycline (VIBRAMYCIN) 100 MG capsule Take 1 capsule (100 mg total) by mouth 2 (two) times daily for 7 days. 14 capsule Mickie Bailate, Waylynn Benefiel H, NP      PDMP not reviewed this encounter.   Mickie Bailate, Brier Reid H, NP 11/29/22 1147

## 2022-11-29 NOTE — Discharge Instructions (Addendum)
Take the doxycycline as directed.  Follow up with your primary care provider on Monday.    

## 2022-11-29 NOTE — ED Notes (Signed)
Same stay scheduling contacted Sabrina Fuentes) to obtain time for patient's stat DVT US. Patient advised to go to [facility] for [place] now.

## 2022-11-29 NOTE — ED Triage Notes (Signed)
Patient to Urgent Care with complaints of right sided calf pain that started this morning. Redness and warmth present. Reports pain worse when putting pressure on her leg. Swelling extending into ankle. No obvious bite/ wound. Reports feeling like she might have a spider bite.   Recently sick with covid.

## 2022-12-03 DIAGNOSIS — Z0289 Encounter for other administrative examinations: Secondary | ICD-10-CM

## 2022-12-04 ENCOUNTER — Encounter (INDEPENDENT_AMBULATORY_CARE_PROVIDER_SITE_OTHER): Payer: Medicaid Other | Admitting: Family Medicine

## 2022-12-09 ENCOUNTER — Encounter: Payer: Self-pay | Admitting: Physician Assistant

## 2022-12-09 DIAGNOSIS — R7303 Prediabetes: Secondary | ICD-10-CM | POA: Insufficient documentation

## 2022-12-16 ENCOUNTER — Encounter: Payer: Self-pay | Admitting: Cardiology

## 2022-12-16 ENCOUNTER — Ambulatory Visit: Payer: Medicaid Other | Attending: Cardiology | Admitting: Cardiology

## 2022-12-16 VITALS — BP 120/88 | HR 73 | Ht 64.0 in | Wt 293.2 lb

## 2022-12-16 DIAGNOSIS — E782 Mixed hyperlipidemia: Secondary | ICD-10-CM | POA: Diagnosis not present

## 2022-12-16 DIAGNOSIS — F17200 Nicotine dependence, unspecified, uncomplicated: Secondary | ICD-10-CM | POA: Diagnosis not present

## 2022-12-16 DIAGNOSIS — R011 Cardiac murmur, unspecified: Secondary | ICD-10-CM

## 2022-12-16 NOTE — Patient Instructions (Signed)
Medication Instructions:   None Ordered  *If you need a refill on your cardiac medications before your next appointment, please call your pharmacy*   Lab Work:  None Ordered  If you have labs (blood work) drawn today and your tests are completely normal, you will receive your results only by: MyChart Message (if you have MyChart) OR A paper copy in the mail If you have any lab test that is abnormal or we need to change your treatment, we will call you to review the results.   Testing/Procedures:  Your physician has requested that you have an echocardiogram. Echocardiography is a painless test that uses sound waves to create images of your heart. It provides your doctor with information about the size and shape of your heart and how well your heart's chambers and valves are working. This procedure takes approximately one hour. There are no restrictions for this procedure. Please do NOT wear cologne, perfume, aftershave, or lotions (deodorant is allowed). Please arrive 15 minutes prior to your appointment time.    Follow-Up: At [facility], you and your health needs are our priority.  As part of our continuing mission to provide you with exceptional heart care, we have created designated Provider Care Teams.  These Care Teams include your primary Cardiologist (physician) and Advanced Practice Providers (APPs -  Physician Assistants and Nurse Practitioners) who all work together to provide you with the care you need, when you need it.  We recommend signing up for the patient portal called "MyChart".  Sign up information is provided on this After Visit Summary.  MyChart is used to connect with patients for Virtual Visits (Telemedicine).  Patients are able to view lab/test results, encounter notes, upcoming appointments, etc.  Non-urgent messages can be sent to your provider as well.   To learn more about what you can do with MyChart, go to [email or web address].    Your next  appointment:   2 month(s)  Provider:   You may see Debbe Odea, MD or one of the following Advanced Practice Providers on your designated Care Team:   Nicolasa Ducking, NP Eula Listen, PA-C Cadence Fransico Michael, PA-C Charlsie Quest, NP

## 2022-12-16 NOTE — Progress Notes (Signed)
Cardiology Office Note:    Date:  12/16/2022   ID:  Sabrina Fuentes, DOB 1971/06/21, MRN [id number]  PCP:  Debera Lat, PA-C   [facility] Providers Cardiologist:  Debbe Odea, MD     Referring MD: Debera Lat, PA-C   Chief Complaint  Patient presents with   New Patient (Initial Visit)    Evaluation for new diagnosis heart murmur.  Currently working on smoking cessation.      History of Present Illness:    Sabrina Fuentes is a 52 y.o. female with a hx of morbid obesity, depression, current smoker presenting due to a heart murmur.  She was told she has a heart murmur by PCP a year and a half ago.  Denies symptoms of chest pain or heart disease.  Is working on quitting smoking, down to about 3 cigarettes a day.  Also establish care with weight loss center in [place], has her first appointment upcoming.  Denies any history of heart disease.  Denies dizziness, presyncope or syncope.  Past Medical History:  Diagnosis Date   Abdominal hernia    Allergy    Clotting disorder    GERD (gastroesophageal reflux disease)    Heart murmur    Sleep apnea     Past Surgical History:  Procedure Laterality Date   TUBAL LIGATION      Current Medications: Current Meds  Medication Sig   buPROPion (WELLBUTRIN XL) 150 MG 24 hr tablet Take 1 tablet (150 mg total) by mouth daily.     Allergies:   Aspirin, Iodinated contrast media, and Penicillins   Social History   Socioeconomic History   Marital status: Single    Spouse name: Not on file   Number of children: Not on file   Years of education: Not on file   Highest education level: GED or equivalent  Occupational History   Not on file  Tobacco Use   Smoking status: Every Day    Packs/day: .25    Types: Cigarettes   Smokeless tobacco: Never  Vaping Use   Vaping Use: Never used  Substance and Sexual Activity   Alcohol use: No   Drug use: No   Sexual activity: Yes  Other Topics Concern   Not on file   Social History Narrative   Not on file   Social Determinants of Health   Financial Resource Strain: Medium Risk (11/27/2022)   Overall Financial Resource Strain (CARDIA)    Difficulty of Paying Living Expenses: Somewhat hard  Food Insecurity: No Food Insecurity (11/27/2022)   Hunger Vital Sign    Worried About Running Out of Food in the Last Year: Never true    Ran Out of Food in the Last Year: Never true  Transportation Needs: No Transportation Needs (11/27/2022)   PRAPARE - Administrator, Civil Service (Medical): No    Lack of Transportation (Non-Medical): No  Recent Concern: Transportation Needs - Unmet Transportation Needs (10/16/2022)   PRAPARE - Transportation    Lack of Transportation (Medical): Yes    Lack of Transportation (Non-Medical): Yes  Physical Activity: Unknown (11/27/2022)   Exercise Vital Sign    Days of Exercise per Week: 0 days    Minutes of Exercise per Session: Not on file  Stress: Stress Concern Present (11/27/2022)   [employer] of Occupational Health - Occupational Stress Questionnaire    Feeling of Stress : To some extent  Social Connections: Moderately Isolated (11/27/2022)   Social Connection and Isolation Panel [NHANES]  Frequency of Communication with Friends and Family: More than three times a week    Frequency of Social Gatherings with Friends and Family: Twice a week    Attends Religious Services: More than 4 times per year    Active Member of [employer] or Organizations: No    Attends Engineer, structural: Not on file    Marital Status: Separated     Family History: The patient's family history includes Arthritis in her maternal grandmother; Diabetes in her father, maternal aunt, paternal aunt, paternal grandmother, and paternal uncle.  ROS:   Please see the history of present illness.     All other systems reviewed and are negative.  EKGs/Labs/Other Studies Reviewed:    The following studies were reviewed today:   EKG:   EKG is  ordered today.  The ekg ordered today demonstrates normal sinus rhythm, normal ECG.  Recent Labs: 10/16/2022: ALT 17; BUN 12; Creatinine, Ser 0.87; Hemoglobin 15.0; Platelets 383; Potassium 5.2; Sodium 138; TSH 2.260  Recent Lipid Panel    Component Value Date/Time   CHOL 232 (H) 10/16/2022 1014   TRIG 155 (H) 10/16/2022 1014   HDL 46 10/16/2022 1014   CHOLHDL 5.0 (H) 10/16/2022 1014   LDLCALC 158 (H) 10/16/2022 1014     Risk Assessment/Calculations:             Physical Exam:    VS:  BP 120/88 (BP Location: Right Arm, Patient Position: Sitting, Cuff Size: Large)   Pulse 73   Ht  (1.626 m)   Wt 293 lb 3.2 oz (133 kg)   LMP 11/26/2022   SpO2 97%   BMI 50.33 kg/m     Wt Readings from Last 3 Encounters:  12/16/22 293 lb 3.2 oz (133 kg)  11/27/22 294 lb (133.4 kg)  10/16/22 291 lb (132 kg)     GEN:  Well nourished, well developed in no acute distress HEENT: Normal NECK: No JVD; No carotid bruits CARDIAC: RRR, 2/6 systolic murmur RESPIRATORY: Diminished breath sounds bilaterally, no wheezing. ABDOMEN: Soft, non-tender, non-distended MUSCULOSKELETAL:  No edema; No deformity  SKIN: Warm and dry NEUROLOGIC:  Alert and oriented x 3 PSYCHIATRIC:  Normal affect   ASSESSMENT:    1. Systolic murmur   2. Smoking   3. Morbid obesity   4. Mixed hyperlipidemia    PLAN:    In order of problems listed above:  Systolic murmur on exam, get echocardiogram. Current smoker, smoking cessation advised. Morbid obesity, low-calorie diet, weight loss advised.  Encouraged to keep appointment with weight loss center. Hyperlipidemia, 10-year ASCVD 4.6%.  Low-cholesterol diet advised.  Follow-up after echocardiogram.     Medication Adjustments/Labs and Tests Ordered: Current medicines are reviewed at length with the patient today.  Concerns regarding medicines are outlined above.  Orders Placed This Encounter  Procedures   ECHOCARDIOGRAM COMPLETE   No orders of the  defined types were placed in this encounter.   Patient Instructions  Medication Instructions:   None Ordered  *If you need a refill on your cardiac medications before your next appointment, please call your pharmacy*   Lab Work:  None Ordered  If you have labs (blood work) drawn today and your tests are completely normal, you will receive your results only by: MyChart Message (if you have MyChart) OR A paper copy in the mail If you have any lab test that is abnormal or we need to change your treatment, we will call you to review the results.  Testing/Procedures:  Your physician has requested that you have an echocardiogram. Echocardiography is a painless test that uses sound waves to create images of your heart. It provides your doctor with information about the size and shape of your heart and how well your heart's chambers and valves are working. This procedure takes approximately one hour. There are no restrictions for this procedure. Please do NOT wear cologne, perfume, aftershave, or lotions (deodorant is allowed). Please arrive 15 minutes prior to your appointment time.    Follow-Up: At [facility], you and your health needs are our priority.  As part of our continuing mission to provide you with exceptional heart care, we have created designated Provider Care Teams.  These Care Teams include your primary Cardiologist (physician) and Advanced Practice Providers (APPs -  Physician Assistants and Nurse Practitioners) who all work together to provide you with the care you need, when you need it.  We recommend signing up for the patient portal called "MyChart".  Sign up information is provided on this After Visit Summary.  MyChart is used to connect with patients for Virtual Visits (Telemedicine).  Patients are able to view lab/test results, encounter notes, upcoming appointments, etc.  Non-urgent messages can be sent to your provider as well.   To learn more about what  you can do with MyChart, go to [email or web address].    Your next appointment:   2 month(s)  Provider:   You may see Debbe Odea, MD or one of the following Advanced Practice Providers on your designated Care Team:   Nicolasa Ducking, NP Eula Listen, PA-C Cadence Fransico Michael, PA-C Charlsie Quest, NP    Signed, Debbe Odea, MD  12/16/2022 10:15 AM    [facility]

## 2022-12-17 NOTE — Addendum Note (Signed)
Addended by: Margrett Rud on: 12/17/2022 12:45 PM   Modules accepted: Orders

## 2022-12-24 ENCOUNTER — Other Ambulatory Visit: Payer: Self-pay | Admitting: Physician Assistant

## 2022-12-24 ENCOUNTER — Ambulatory Visit (INDEPENDENT_AMBULATORY_CARE_PROVIDER_SITE_OTHER): Payer: Medicaid Other | Admitting: Family Medicine

## 2022-12-24 ENCOUNTER — Encounter (INDEPENDENT_AMBULATORY_CARE_PROVIDER_SITE_OTHER): Payer: Self-pay | Admitting: Family Medicine

## 2022-12-24 VITALS — BP 137/77 | HR 78 | Temp 97.6°F | Ht 62.5 in | Wt 289.0 lb

## 2022-12-24 DIAGNOSIS — F339 Major depressive disorder, recurrent, unspecified: Secondary | ICD-10-CM

## 2022-12-24 DIAGNOSIS — R7303 Prediabetes: Secondary | ICD-10-CM

## 2022-12-24 DIAGNOSIS — R5383 Other fatigue: Secondary | ICD-10-CM

## 2022-12-24 DIAGNOSIS — G479 Sleep disorder, unspecified: Secondary | ICD-10-CM | POA: Diagnosis not present

## 2022-12-24 DIAGNOSIS — F17200 Nicotine dependence, unspecified, uncomplicated: Secondary | ICD-10-CM

## 2022-12-24 DIAGNOSIS — F5089 Other specified eating disorder: Secondary | ICD-10-CM | POA: Diagnosis not present

## 2022-12-24 DIAGNOSIS — Z1331 Encounter for screening for depression: Secondary | ICD-10-CM

## 2022-12-24 DIAGNOSIS — R0602 Shortness of breath: Secondary | ICD-10-CM

## 2022-12-24 NOTE — Progress Notes (Signed)
Sabrina Fuentes, D.O.  ABFM, ABOM Specializing in Clinical Bariatric Medicine Office located at: [address] [place], [place]  [address]     Initial Bariatric Medicine Consultation Visit  Dear Sabrina Lat, PA-C   Thank you for referring Sabrina Fuentes to our clinic today for evaluation.  We performed a consultation to discuss her options for treatment and educate the patient on her disease state.  The following note includes my evaluation and treatment recommendations.   Please do not hesitate to reach out to me directly if you have any further concerns.    Assessment and Plan:   Orders Placed This Encounter  Procedures   CBC with Differential/Platelet   Comprehensive metabolic panel   Hemoglobin A1c   Insulin, random   Lipid panel   T4, free   T3   TSH   VITAMIN D 25 Hydroxy (Vit-D Deficiency, Fractures)   Vitamin B12    Medications Discontinued During This Encounter  Medication Reason   Varenicline Tartrate, Starter, (CHANTIX STARTING MONTH PAK) 0.5 MG X 11 & 1 MG X 42 TBPK Patient Preference   oseltamivir (TAMIFLU) 75 MG capsule Completed Course   benzonatate (TESSALON) 100 MG capsule Completed Course     No orders of the defined types were placed in this encounter.    1) Fatigue Assessment: Condition is Not optimized. Sabrina Fuentes does feel that her weight is causing her energy to be lower than it should be. Fatigue may be related to obesity, depression or many other causes. she does not appear to have any red flag symptoms and this appears to most likely related to her current lifestyle habits and dietary intake.  Plan:  Labs will be ordered and reviewed with her at their next office visit in two weeks. Epworth sleepiness scale score is 9.   Sabrina Fuentes admits to occasional daytime somnolence and admits to waking up still tired. Sabrina Fuentes generally gets  4-6  hours of sleep per night, and states that she has generally unrestful sleep. Snoring is present. Apneic  episodes is present.  ECG-: Normal sinus rhythm, rate 73 bpm; reassuring without any acute abnormalities, will continue to monitor for symptoms. Done on 12/16/22 Modified PHQ-9 Depression Screen: Her Food and Mood (modified PHQ-9) score was 17 . In the meanwhile, Sabrina Fuentes will focus on self care including making healthy food choices by following their meal plan, improving sleep quality and focusing on stress reduction.  Once we are assured she is on an appropriate meal plan, we will start discussing exercise to increase cardiovascular fitness levels.    2) Shortness of breath on exertion Assessment: Condition is Not optimized.Sabrina Fuentes does feel that she gets out of breath more easily than she used to when she exercises and seems to be worsening over time with weight gain.  This has gotten worse recently. Sabrina Fuentes denies shortness of breath at rest or orthopnea. Sabrina Fuentes's shortness of breath appears to be obesity related and exercise induced, as they do not appear to have any "red flag" symptoms/ concerns today.  Also, this condition appears to be related to a state of poor cardiovascular conditioning   Plan:  Indirect Calorimeter completed today to help guide our dietary regimen. It shows a VO2 of 326 and a REE of 2246.  Her calculated basal metabolic rate is 5784 thus her measured basal metabolic rate is better than expected. Patient agreed to work on weight loss at this time.  As Sabrina Fuentes progresses through our weight loss program, we  will gradually increase exercise as tolerated to treat her current condition.   If Sabrina Fuentes follows our recommendations and loses 5-10% of their weight without improvement of her shortness of breath or if at any time, symptoms become more concerning, they agree to urgently follow up with their PCP/ specialist for further consideration/ evaluation.   Sabrina Fuentes verbalizes agreement with this plan.   Depression screening   Prediabetes Assessment: Condition is not optimized.  Lab  Results  Component Value Date   HGBA1C 5.7 (H) 10/16/2022  - Diagnosed with Prediabetes on 10/16/22, labs from that date showed elevated A1c of 5.7  - Patient is not currently taking any Prediabetes medication. This is diet controlled.   Plan: - Check labs.  - Begin to continue decreasing simple carbs/ sugars; increase fiber and proteins -> follow her meal plan.   Sabrina Fuentes will begin to work on weight loss, exercise, via their meal plan we devised to help decrease the risk of progressing to diabetes.    Difficulty sleeping Assessment: Condition is Improving, but not optimized.. - Patient reports sleeping 4-6 hours when she was working in third shift.  - She endorses that her sleep has improved recently because she is no longer working third shift. Now a days, she is sleeping from 9 pm -5 am - She has been never been officially diagnosed with OSA.   Plan: - Due to her ESS of 9, I highly recommended patient to get a sleep study. Patient endorses that is working on getting a sleep study scheduled.   Depression, recurrent (HCC)- Emotional Eating Assessment: Condition is stable.  - Denies any SI/HI. Mood is stable. - She started Wellbutrin XL 150 mg daily approximately one month ago as recommended by PCP. - She endorses that the Wellbutrin is helping with her depression.  - Patient reports eating when stressed, sad, bored, guilty, to help stay awake, and to comfort herself.   Plan:  - Check labs  - Continue with Wellbutrin as prescribed by PCP.  - Patient was referred to Dr. Dewaine Conger, our Bariatric Psychologist, for evaluation due to her elevated PHQ-9 score and struggles with emotional eating.  - Reminded patient of the importance of following their prudent nutrition plan and how food can affect mood as well to support emotional wellbeing.   TREATMENT PLAN FOR OBESITY: Morbid obesity (HCC) Assessment: Condition is not optimized.  Fat mass is 153.8 lbs.  Muscle mass is 128.6 lbs.  Total  body water is 93.8 lbs.   Plan:  Sabrina Fuentes is currently in the action stage of change. As such, her goal is to continue weight management plan. [place] will work on healthier eating habits and begin Category 3 meal plan - I extensively reviewed the Category 3 meal plan with the patient and all questions were answered.   Behavioral Intervention Additional resources provided today: category 3 meal plan information Evidence-based interventions for health behavior change were utilized today including the discussion of self monitoring techniques, problem-solving barriers and SMART goal setting techniques.   Regarding patient's less desirable eating habits and patterns, we employed the technique of small changes.  Pt will specifically work on: beginning her prudent nutritional plan for next visit.    Recommended Physical Activity Goals Sabrina Fuentes has been advised to work up to 150 minutes of moderate intensity aerobic activity a week and strengthening exercises 2-3 times per week for cardiovascular health, weight loss maintenance and preservation of muscle mass.  She has agreed to continue their current level of activity  FOLLOW UP: Follow up in 2 weeks. She was informed of the importance of frequent follow up visits to maximize her success with intensive lifestyle modifications for her multiple health conditions.  Sabrina Fuentes is aware that we will review all of her lab results at our next visit.  She is aware that if anything is critical/ life threatening with the results, we will be contacting her via MyChart prior to the office visit to discuss management.    Chief Complaint:   OBESITY Sabrina Fuentes (MR# [id number]) is a 52 y.o. female who presents for evaluation and treatment of obesity and related comorbidities. Current BMI is Body mass index is 52.02 kg/m. Sabrina Fuentes has been struggling with her weight for many years and has been unsuccessful in either losing weight, maintaining weight loss, or  reaching her healthy weight goal.  Sabrina Fuentes is currently in the action stage of change and ready to dedicate time achieving and maintaining a healthier weight. Sabrina Fuentes is interested in becoming our patient and working on intensive lifestyle modifications including (but not limited to) diet and exercise for weight loss.  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 Patient is single and has 5 children. She lives with 3 of her children - 22 y/o, 15 y/o, and 40 y/o.  Sabrina Fuentes's habits were reviewed today and are as follows:   - She desires to be down 120 lbs in one year.   - She has never been on a diet plan in the past.   - She endorses that eating healthy has been financially difficult.   -  2 years ago, she was on Ozempic for 6 months. She endorses that while on the Ozempic, she did not eat a lot food and was not on any meal plan. She discontinued the Ozempic because her insurance no longer covered the med.   - She eats outside the home approximately 1x a week.   - She craves chocolate and potato chips and snacks on potato ships.   - Her worst food habit is eating carbs (pasta, rice and potatoes)  - She skips breakfast daily   Subjective:   This is the patient's first visit at [facility].  The patient's NEW PATIENT PACKET that they filled out prior to today's office visit was reviewed at length and information from that paperwork was included within the following office visit note.    Included in the packet: current and past health history, medications, allergies, ROS, gynecologic history (women only), surgical history, family history, social history, weight history, weight loss surgery history (for those that have had weight loss surgery), nutritional evaluation, mood and food questionnaire along with a depression screening (PHQ9) on all patients, an Epworth questionnaire, sleep habits questionnaire, patient life and health  improvement goals questionnaire. These will all be scanned into the patient's chart under the "media" tab.   Review of Systems: Please refer to new patient packet scanned into media. Pertinent positives were addressed with patient today.   Objective:   PHYSICAL EXAM: Blood pressure 137/77, pulse 78, temperature 97.6 F (36.4 C), height 5' 2.5" (1.588 m), weight 289 lb (131.1 kg), last menstrual period 11/26/2022, SpO2 97 %. Body mass index is 52.02 kg/m. General: Well Developed, well nourished, and in no acute distress.  HEENT: Normocephalic, atraumatic Skin: Warm and dry, cap RF less 2 sec, good turgor Chest:  Normal excursion, shape, no gross abn Respiratory: speaking in  full sentences, no conversational dyspnea NeuroM-Sk: Ambulates w/o assistance, moves * 4 Psych: A and O *3, insight good, mood-full  Anthropometric Measurements Height: 5' 2.5" (1.588 m) Weight: 289 lb (131.1 kg) BMI (Calculated): 51.98 Weight at Last Visit: N/A Weight Lost Since Last Visit: N/A Weight Gained Since Last Visit: N/A Starting Weight: 289 lb Waist Measurement : 51 inches   Body Composition  Body Fat %: 53.2 % Fat Mass (lbs): 153.8 lbs Muscle Mass (lbs): 128.6 lbs Total Body Water (lbs): 93.8 lbs Visceral Fat Rating : 20   Other Clinical Data RMR: [id number] Fasting: YES Labs: YES Today's Visit #: #1 Starting Date: 12/24/22 Comments: FIRST VISIT    DIAGNOSTIC DATA REVIEWED:  BMET    Component Value Date/Time   NA 138 10/16/2022 1014   NA 140 09/29/2014 1111   K 5.2 10/16/2022 1014   K 4.3 09/29/2014 1111   CL 101 10/16/2022 1014   CL 107 09/29/2014 1111   CO2 23 10/16/2022 1014   CO2 25 09/29/2014 1111   GLUCOSE 98 10/16/2022 1014   GLUCOSE 107 (H) 06/05/2020 2329   GLUCOSE 85 09/29/2014 1111   BUN 12 10/16/2022 1014   BUN 11 09/29/2014 1111   CREATININE 0.87 10/16/2022 1014   CREATININE 0.46 (L) 09/29/2014 1111   CALCIUM 9.6 10/16/2022 1014   CALCIUM 8.4 (L) 09/29/2014  1111   GFRNONAA >60 06/05/2020 2329   GFRNONAA >60 09/29/2014 1111   GFRNONAA >60 03/27/2014 1836   GFRAA >60 07/08/2017 0750   GFRAA >60 09/29/2014 1111   GFRAA >60 03/27/2014 1836   Lab Results  Component Value Date   HGBA1C 5.7 (H) 10/16/2022   No results found for: "INSULIN" Lab Results  Component Value Date   TSH 2.260 10/16/2022   CBC    Component Value Date/Time   WBC 8.2 10/16/2022 1014   WBC 8.2 06/05/2020 2329   RBC 4.88 10/16/2022 1014   RBC 4.17 06/05/2020 2329   HGB 15.0 10/16/2022 1014   HCT 44.9 10/16/2022 1014   PLT 383 10/16/2022 1014   MCV 92 10/16/2022 1014   MCV 82 10/03/2014 0821   MCH 30.7 10/16/2022 1014   MCH 31.7 06/05/2020 2329   MCHC 33.4 10/16/2022 1014   MCHC 33.6 06/05/2020 2329   RDW 12.8 10/16/2022 1014   RDW 16.1 (H) 10/03/2014 0821   Iron Studies No results found for: "IRON", "TIBC", "FERRITIN", "IRONPCTSAT" Lipid Panel     Component Value Date/Time   CHOL 232 (H) 10/16/2022 1014   TRIG 155 (H) 10/16/2022 1014   HDL 46 10/16/2022 1014   CHOLHDL 5.0 (H) 10/16/2022 1014   LDLCALC 158 (H) 10/16/2022 1014   Hepatic Function Panel     Component Value Date/Time   PROT 6.7 10/16/2022 1014   PROT 6.5 03/27/2014 1836   ALBUMIN 4.4 10/16/2022 1014   ALBUMIN 2.8 (L) 03/27/2014 1836   AST 12 10/16/2022 1014   AST 16 09/29/2014 1111   ALT 17 10/16/2022 1014   ALT 25 03/27/2014 1836   ALKPHOS 62 10/16/2022 1014   ALKPHOS 52 03/27/2014 1836   BILITOT 0.3 10/16/2022 1014   BILITOT 0.1 (L) 03/27/2014 1836      Component Value Date/Time   TSH 2.260 10/16/2022 1014   Nutritional No results found for: "VD25OH"  Attestation Statements:   Reviewed by clinician on day of visit: allergies, medications, problem list, medical history, surgical history, family history, social history, and previous encounter notes.  During the visit, I independently reviewed  the patient's EKG, bioimpedance scale results, and indirect calorimeter results.  I used this information to tailor a meal plan for the patient that will help Sabrina Fuentes to lose weight and will improve her obesity-related conditions going forward.  I performed a medically necessary appropriate examination and/or evaluation. I discussed the assessment and treatment plan with the patient. The patient was provided an opportunity to ask questions and all were answered. The patient agreed with the plan and demonstrated an understanding of the instructions. Labs were ordered today (unless patient declined them) and will be reviewed with the patient at our next visit unless more critical results need to be addressed immediately. Clinical information was updated and documented in the EMR.  Time spent on visit including pre-visit chart review and post-visit care was estimated to be 60  minutes.    I,Sabrina Fuentes,acting as a Neurosurgeon for [employer], DO.,have documented all relevant documentation on the behalf of Thomasene Lot, DO,as directed by  Thomasene Lot, DO while in the presence of Thomasene Lot, DO.   I, Thomasene Lot, DO, have reviewed all documentation for this visit. The documentation on 12/24/22 for the exam, diagnosis, procedures, and orders are all accurate and complete.

## 2022-12-25 LAB — COMPREHENSIVE METABOLIC PANEL
ALT: 18 IU/L (ref 0–32)
AST: 14 IU/L (ref 0–40)
Albumin/Globulin Ratio: 2.1 (ref 1.2–2.2)
Albumin: 4.4 g/dL (ref 3.9–4.9)
Alkaline Phosphatase: 58 IU/L (ref 44–121)
BUN/Creatinine Ratio: 19 (ref 9–23)
BUN: 14 mg/dL (ref 6–24)
Bilirubin Total: 0.4 mg/dL (ref 0.0–1.2)
CO2: 20 mmol/L (ref 20–29)
Calcium: 9 mg/dL (ref 8.7–10.2)
Chloride: 101 mmol/L (ref 96–106)
Creatinine, Ser: 0.73 mg/dL (ref 0.57–1.00)
Globulin, Total: 2.1 g/dL (ref 1.5–4.5)
Glucose: 101 mg/dL — ABNORMAL HIGH (ref 70–99)
Potassium: 4.7 mmol/L (ref 3.5–5.2)
Sodium: 137 mmol/L (ref 134–144)
Total Protein: 6.5 g/dL (ref 6.0–8.5)
eGFR: 101 mL/min/{1.73_m2} (ref >59)

## 2022-12-25 LAB — CBC WITH DIFFERENTIAL/PLATELET
Basophils Absolute: 0.1 10*3/uL (ref 0.0–0.2)
Basos: 1 %
EOS (ABSOLUTE): 0.1 10*3/uL (ref 0.0–0.4)
Eos: 3 %
Hematocrit: 43.6 % (ref 34.0–46.6)
Hemoglobin: 14.5 g/dL (ref 11.1–15.9)
Immature Grans (Abs): 0 10*3/uL (ref 0.0–0.1)
Immature Granulocytes: 0 %
Lymphocytes Absolute: 1.3 10*3/uL (ref 0.7–3.1)
Lymphs: 30 %
MCH: 30.5 pg (ref 26.6–33.0)
MCHC: 33.3 g/dL (ref 31.5–35.7)
MCV: 92 fL (ref 79–97)
Monocytes Absolute: 0.5 10*3/uL (ref 0.1–0.9)
Monocytes: 12 %
Neutrophils Absolute: 2.3 10*3/uL (ref 1.4–7.0)
Neutrophils: 54 %
Platelets: 336 10*3/uL (ref 150–450)
RBC: 4.76 x10E6/uL (ref 3.77–5.28)
RDW: 12.9 % (ref 11.7–15.4)
WBC: 4.3 10*3/uL (ref 3.4–10.8)

## 2022-12-25 LAB — HEMOGLOBIN A1C
Est. average glucose Bld gHb Est-mCnc: 120 mg/dL
Hgb A1c MFr Bld: 5.8 % — ABNORMAL HIGH (ref 4.8–5.6)

## 2022-12-25 LAB — TSH: TSH: 1.54 u[IU]/mL (ref 0.450–4.500)

## 2022-12-25 LAB — LIPID PANEL
Chol/HDL Ratio: 4.9 ratio — ABNORMAL HIGH (ref 0.0–4.4)
Cholesterol, Total: 226 mg/dL — ABNORMAL HIGH (ref 100–199)
HDL: 46 mg/dL (ref >39)
LDL Chol Calc (NIH): 155 mg/dL — ABNORMAL HIGH (ref 0–99)
Triglycerides: 137 mg/dL (ref 0–149)
VLDL Cholesterol Cal: 25 mg/dL (ref 5–40)

## 2022-12-25 LAB — VITAMIN B12: Vitamin B-12: 641 pg/mL (ref 232–1245)

## 2022-12-25 LAB — T3: T3, Total: 116 ng/dL (ref 71–180)

## 2022-12-25 LAB — INSULIN, RANDOM: INSULIN: 13 u[IU]/mL (ref 2.6–24.9)

## 2022-12-25 LAB — T4, FREE: Free T4: 1.03 ng/dL (ref 0.82–1.77)

## 2022-12-25 LAB — VITAMIN D 25 HYDROXY (VIT D DEFICIENCY, FRACTURES): Vit D, 25-Hydroxy: 23.1 ng/mL — ABNORMAL LOW (ref 30.0–100.0)

## 2022-12-25 NOTE — Telephone Encounter (Signed)
Appt- 01/28/23 Requested Prescriptions  Pending Prescriptions Disp Refills   buPROPion (WELLBUTRIN XL) 150 MG 24 hr tablet [Pharmacy Med Name: BUPROPION XL 150MG  TABLETS (24 H)] 30 tablet 0    Sig: TAKE 1 TABLET(150 MG) BY MOUTH DAILY     Psychiatry: Antidepressants - bupropion Passed - 12/24/2022  3:36 AM      Passed - Cr in normal range and within 360 days    Creatinine  Date Value Ref Range Status  09/29/2014 0.46 (L) 0.60 - 1.30 mg/dL Final   Creatinine, Ser  Date Value Ref Range Status  12/24/2022 0.73 0.57 - 1.00 mg/dL Final         Passed - AST in normal range and within 360 days    AST  Date Value Ref Range Status  12/24/2022 14 0 - 40 IU/L Final   SGOT(AST)  Date Value Ref Range Status  09/29/2014 16 15 - 37 Unit/L Final         Passed - ALT in normal range and within 360 days    ALT  Date Value Ref Range Status  12/24/2022 18 0 - 32 IU/L Final   SGPT (ALT)  Date Value Ref Range Status  03/27/2014 25 U/L Final    Comment:    14-63 NOTE: New Reference Range 03/15/14          Passed - Completed PHQ-2 or PHQ-9 in the last 360 days      Passed - Last BP in normal range    BP Readings from Last 1 Encounters:  12/24/22 137/77         Passed - Valid encounter within last 6 months    Recent Outpatient Visits           4 weeks ago Prediabetes   [place] [facility] [place], [place], PA-C   1 month ago COVID-19   [facility] [place], [place], PA-C   2 months ago Morbid obesity [facility])   [place] [facility] [place], [place], PA-C       Future Appointments             In 1 month [place], Edmon Crape, PA-C  [facility]   In 1 month Agbor-Etang, Arlys John, MD [facility]

## 2022-12-30 ENCOUNTER — Encounter: Payer: Medicaid Other | Admitting: Physician Assistant

## 2023-01-07 ENCOUNTER — Encounter (INDEPENDENT_AMBULATORY_CARE_PROVIDER_SITE_OTHER): Payer: Self-pay | Admitting: Family Medicine

## 2023-01-07 ENCOUNTER — Ambulatory Visit (INDEPENDENT_AMBULATORY_CARE_PROVIDER_SITE_OTHER): Payer: Medicaid Other | Admitting: Family Medicine

## 2023-01-07 VITALS — BP 139/85 | HR 84 | Temp 98.6°F | Ht 62.5 in | Wt 286.0 lb

## 2023-01-07 DIAGNOSIS — E782 Mixed hyperlipidemia: Secondary | ICD-10-CM | POA: Diagnosis not present

## 2023-01-07 DIAGNOSIS — F17210 Nicotine dependence, cigarettes, uncomplicated: Secondary | ICD-10-CM | POA: Diagnosis not present

## 2023-01-07 DIAGNOSIS — R7303 Prediabetes: Secondary | ICD-10-CM | POA: Diagnosis not present

## 2023-01-07 DIAGNOSIS — Z6841 Body Mass Index (BMI) 40.0 and over, adult: Secondary | ICD-10-CM

## 2023-01-07 DIAGNOSIS — E559 Vitamin D deficiency, unspecified: Secondary | ICD-10-CM

## 2023-01-07 MED ORDER — VITAMIN D (ERGOCALCIFEROL) 1.25 MG (50000 UNIT) PO CAPS
50000.0000 [IU] | ORAL_CAPSULE | ORAL | 0 refills | Status: DC
Start: 2023-01-07 — End: 2023-02-03

## 2023-01-07 NOTE — Patient Instructions (Signed)
The 10-year ASCVD risk score (Arnett DK, et al., 2019) is: 5.9%   Values used to calculate the score:     Age: 52 years     Sex: Female     Is Non-Hispanic African American: No     Diabetic: No     Tobacco smoker: Yes     Systolic Blood Pressure: 139 mmHg     Is BP treated: No     HDL Cholesterol: 46 mg/dL     Total Cholesterol: 226 mg/dL

## 2023-01-07 NOTE — Progress Notes (Signed)
Carlye Grippe, D.O.  ABFM, ABOM Specializing in Clinical Bariatric Medicine  Office located at: [address] [place], [place]  [address]     Assessment and Plan:   No orders of the defined types were placed in this encounter.   There are no discontinued medications.   Meds ordered this encounter  Medications   Vitamin D, Ergocalciferol, (DRISDOL) 1.25 MG (50000 UNIT) CAPS capsule    Sig: Take 1 capsule (50,000 Units total) by mouth every 7 (seven) days.    Dispense:  4 capsule    Refill:  0    Prediabetes Assessment: Condition is not optimized. Labs were reviewed.  Lab Results  Component Value Date   HGBA1C 5.8 (H) 12/24/2022   HGBA1C 5.7 (H) 10/16/2022   INSULIN 13.0 12/24/2022   Lab Results  Component Value Date   VITAMINB12 641 12/24/2022    Lab Results  Component Value Date   TSH 1.540 12/24/2022   FREET4 1.03 12/24/2022   Lab Results  Component Value Date   WBC 4.3 12/24/2022   HGB 14.5 12/24/2022   HCT 43.6 12/24/2022   MCV 92 12/24/2022   PLT 336 12/24/2022   Lab Results  Component Value Date   CREATININE 0.73 12/24/2022   BUN 14 12/24/2022   NA 137 12/24/2022   K 4.7 12/24/2022   CL 101 12/24/2022   CO2 20 12/24/2022      Component Value Date/Time   PROT 6.5 12/24/2022 0940   PROT 6.5 03/27/2014 1836   ALBUMIN 4.4 12/24/2022 0940   ALBUMIN 2.8 (L) 03/27/2014 1836   AST 14 12/24/2022 0940   AST 16 09/29/2014 1111   ALT 18 12/24/2022 0940   ALT 25 03/27/2014 1836   ALKPHOS 58 12/24/2022 0940   ALKPHOS 52 03/27/2014 1836   BILITOT 0.4 12/24/2022 0940   BILITOT 0.1 (L) 03/27/2014 1836  Patient is not on any prediabetic medication. This is diet controlled.  Labs indicate that: - Her A1c levels have worsened from 5.7 to 5.8. - Her Insulin levels are over 2 times the normal value.  - Her B12 levels are within the normal limits of 500+  - Her Thyroid function is normal.  - Blood counts are within normal limits and do not  indicate anemia.  - Her kidney and liver function are stable.  - Labs do not indicate evidence of Fatty Liver.   Plan: - Stressed importance of dietary and lifestyle modifications to result in weight loss as first line txmnt - Continue to decrease simple carbs/ sugars; increase fiber and proteins -> follow her meal plan.   - Explained role of simple carbs and insulin levels on hunger and cravings - Handouts provided at pt's request after education provided.  All concerns/questions addressed.   - Anticipatory guidance given.   [name] will continue to work on weight loss, exercise, via their meal plan we devised to help decrease the risk of progressing to diabetes.    Vitamin D Deficiency Assessment: Condition is new and not optimized. Labs were reviewed. Lab Results  Component Value Date   VD25OH 23.1 (L) 12/24/2022  - Labs indicate that her Vitamin D levels are not within the normal limits of 50-80.  - She is not taking any OTC Vitamin D supplements. This is diet controlled.   Plan: - Begin Ergocalciferol 50K IU weekly  - I discussed the importance of vitamin D to the patient's health and well-being as well as to their ability to  lose weight.  - I reviewed possible symptoms of low Vitamin D:  low energy, depressed mood, muscle aches, joint aches, osteoporosis etc. with patient - It has been show that administration of vitamin D supplementation leads to improved satiety and a decrease in inflammatory markers.  Hence, low Vitamin D levels may be linked to an increased risk of cardiovascular events and even increased risk of cancers- such as colon and breast. - Informed patient this may be a lifelong thing, and she was encouraged to continue to take the medicine until told otherwise.    - weight loss will likely improve availability of vitamin D, thus encouraged Sabrina Fuentes to continue with meal plan and their weight loss efforts to further improve this condition.  Thus, we will need to monitor  levels regularly (every 3-4 mo on average) to keep levels within normal limits and prevent over supplementation.   Smoking greater than 30 pack years- recently changed to vaping 3 week ago.    Mixed Hyperlipidemia Assessment: Condition is new and not optimized. Labs were reviewed.  Lab Results  Component Value Date   CHOL 226 (H) 12/24/2022   HDL 46 12/24/2022   LDLCALC 155 (H) 12/24/2022   TRIG 137 12/24/2022   CHOLHDL 4.9 (H) 12/24/2022   The 10-year ASCVD risk score (Arnett DK, et al., 2019) is: 5.9%   Values used to calculate the score:     Age: 57 years     Sex: Female     Is Non-Hispanic African American: No     Diabetic: No     Tobacco smoker: Yes     Systolic Blood Pressure: 139 mmHg     Is BP treated: No     HDL Cholesterol: 46 mg/dL     Total Cholesterol: 226 mg/dL   She is currently not on any cholesterol medication. This is diet controlled. Labs indicate that: - Her cholesterol levels are above the recommended range of 100-199 mg/dL - Her LDL Chol Calc levels are above the recommended range of 0-99 mg/dL - Her Chol/HDL ratio is above the recommended 0.0- 4.4 ratio.   Plan:  - Based on the patients 10-year ASCVD risk score of 5.9%, I recommended patient to discuss with PCP about possibly starting a cholesterol medication.  - She will continue to work on diet, exercise, weight loss efforts, and not smoking. - I stressed the importance that patient continue with our prudent nutritional plan that is low in saturated and trans fats, and low in fatty carbs to improve these numbers.  - We will continue routine screening as patient continues to achieve health goals along their weight loss journey   TREATMENT PLAN FOR OBESITY: Morbid obesity (HCC) Assessment:  Alease Frame is here to discuss her progress with her obesity treatment plan along with follow-up of her obesity related diagnoses. See Medical Weight Management Flowsheet for complete bioelectrical impedance  results.  Condition is improving, but not optimized.  Biometric data collected today, was reviewed with patient.   Since last office visit patient's Fat mass has decreased by 3.8 lb. Muscle mass has increased by 1 lb. Total body water has increased by 0.6 lb.  Counseling done on how various foods will affect these numbers and how to maximize success  Total lbs lost to date: 3  Total weight loss percentage to date: 1.04    Plan:  Andia is currently in the action stage of change. As such, her goal is to continue her weight management plan. Sisira will  work on healthier eating habits and continue the Category 3 meal plan with breakfast options.   Behavioral Intervention Additional resources provided today: category 2 meal plan information, breakfast options, and Prediabetes and Insulin Resistance Handout Evidence-based interventions for health behavior change were utilized today including the discussion of self monitoring techniques, problem-solving barriers and SMART goal setting techniques.   Regarding patient's less desirable eating habits and patterns, we employed the technique of small changes.  Pt will specifically work on: continue adherence to prudent nutritional plan for next visit.    Recommended Physical Activity Goals Loreena has been advised to slowly work up to 150 minutes of moderate intensity aerobic activity a week and strengthening exercises 2-3 times per week for cardiovascular health, weight loss maintenance and preservation of muscle mass.   She has agreed to Continue current level of physical activity   FOLLOW UP: Return in about 2 weeks (around 01/21/2023). She was informed of the importance of frequent follow up visits to maximize her success with intensive lifestyle modifications for her multiple health conditions.  Subjective:   Chief complaint: Obesity Conrada is here to discuss her progress with her obesity treatment plan. She is on the the Category 3 Plan and  states she is following her eating plan approximately 50 % of the time. She states she is exercising (treadmill) 35 minutes 3 days per week.  Interval History:  LESLEYANN RO is here today for her first follow-up office visit since starting the program with [place].   Since last office visit she  - Did not start the meal plan for the first week because her grandfather passed away. - Started following meal plan strictly the second week.  - Did not like the cottage cheese and greek yogurt. - The week she followed the meal plan she endorses feeling hungry at night.  - At night she is eating 4 ounces of protein.  - She has not been using butter and oils to cook any of her foods.  - Quit smoking 3 weeks ago (had been smoking for 20 yrs) and she switched to vaping.    All blood work/ lab tests that were recently ordered by myself or an outside provider were reviewed with patient today per their request. Extended time was spent counseling her on all new disease processes that were discovered or preexisting ones that are affected by BMI.  she understands that many of these abnormalities will need to monitored regularly along with the current treatment plan of prudent dietary changes, in which we are making each and every office visit, to improve these health parameters.   Review of Systems:  Pertinent positives were addressed with patient today.  Weight Summary and Biometrics   Weight Lost Since Last Visit: 3 lb  Weight Gained Since Last Visit: 0   Vitals Temp: 98.6 F (37 C) BP: 139/85 Pulse Rate: 84 SpO2: 97 %   Anthropometric Measurements Height: 5' 2.5" (1.588 m) Weight: 286 lb (129.7 kg) BMI (Calculated): 51.44 Weight at Last Visit: 289 lb Weight Lost Since Last Visit: 3 lb Weight Gained Since Last Visit: 0 Starting Weight: 289 lb Total Weight Loss (lbs): 3 lb (1.361 kg) Peak Weight: 289 lb   Body Composition  Body Fat %: 52.4 % Fat Mass (lbs): 150 lbs Muscle Mass (lbs):  129.6 lbs Total Body Water (lbs): 94.4 lbs Visceral Fat Rating : 20   Other Clinical Data Fasting: No Labs: No Today's Visit #: 2 Starting Date: 12/24/22   Objective:  PHYSICAL EXAM:  Blood pressure 139/85, pulse 84, temperature 98.6 F (37 C), height 5' 2.5" (1.588 m), weight 286 lb (129.7 kg), SpO2 97 %. Body mass index is 51.48 kg/m.  General: Well Developed, well nourished, and in no acute distress.  HEENT: Normocephalic, atraumatic Skin: Warm and dry, cap RF less 2 sec, good turgor Chest:  Normal excursion, shape, no gross abn Respiratory: speaking in full sentences, no conversational dyspnea NeuroM-Sk: Ambulates w/o assistance, moves * 4 Psych: A and O *3, insight good, mood-full  DIAGNOSTIC DATA REVIEWED:  BMET    Component Value Date/Time   NA 137 12/24/2022 0940   NA 140 09/29/2014 1111   K 4.7 12/24/2022 0940   K 4.3 09/29/2014 1111   CL 101 12/24/2022 0940   CL 107 09/29/2014 1111   CO2 20 12/24/2022 0940   CO2 25 09/29/2014 1111   GLUCOSE 101 (H) 12/24/2022 0940   GLUCOSE 107 (H) 06/05/2020 2329   GLUCOSE 85 09/29/2014 1111   BUN 14 12/24/2022 0940   BUN 11 09/29/2014 1111   CREATININE 0.73 12/24/2022 0940   CREATININE 0.46 (L) 09/29/2014 1111   CALCIUM 9.0 12/24/2022 0940   CALCIUM 8.4 (L) 09/29/2014 1111   GFRNONAA >60 06/05/2020 2329   GFRNONAA >60 09/29/2014 1111   GFRNONAA >60 03/27/2014 1836   GFRAA >60 07/08/2017 0750   GFRAA >60 09/29/2014 1111   GFRAA >60 03/27/2014 1836   Lab Results  Component Value Date   HGBA1C 5.8 (H) 12/24/2022   HGBA1C 5.7 (H) 10/16/2022   Lab Results  Component Value Date   INSULIN 13.0 12/24/2022   Lab Results  Component Value Date   TSH 1.540 12/24/2022   CBC    Component Value Date/Time   WBC 4.3 12/24/2022 0940   WBC 8.2 06/05/2020 2329   RBC 4.76 12/24/2022 0940   RBC 4.17 06/05/2020 2329   HGB 14.5 12/24/2022 0940   HCT 43.6 12/24/2022 0940   PLT 336 12/24/2022 0940   MCV 92  12/24/2022 0940   MCV 82 10/03/2014 0821   MCH 30.5 12/24/2022 0940   MCH 31.7 06/05/2020 2329   MCHC 33.3 12/24/2022 0940   MCHC 33.6 06/05/2020 2329   RDW 12.9 12/24/2022 0940   RDW 16.1 (H) 10/03/2014 0821   Iron Studies No results found for: "IRON", "TIBC", "FERRITIN", "IRONPCTSAT" Lipid Panel     Component Value Date/Time   CHOL 226 (H) 12/24/2022 0940   TRIG 137 12/24/2022 0940   HDL 46 12/24/2022 0940   CHOLHDL 4.9 (H) 12/24/2022 0940   LDLCALC 155 (H) 12/24/2022 0940   Hepatic Function Panel     Component Value Date/Time   PROT 6.5 12/24/2022 0940   PROT 6.5 03/27/2014 1836   ALBUMIN 4.4 12/24/2022 0940   ALBUMIN 2.8 (L) 03/27/2014 1836   AST 14 12/24/2022 0940   AST 16 09/29/2014 1111   ALT 18 12/24/2022 0940   ALT 25 03/27/2014 1836   ALKPHOS 58 12/24/2022 0940   ALKPHOS 52 03/27/2014 1836   BILITOT 0.4 12/24/2022 0940   BILITOT 0.1 (L) 03/27/2014 1836      Component Value Date/Time   TSH 1.540 12/24/2022 0940   Nutritional Lab Results  Component Value Date   VD25OH 23.1 (L) 12/24/2022    Attestations:   Reviewed by clinician on day of visit: allergies, medications, problem list, medical history, surgical history, family history, social history, and previous encounter notes.   Patient was in the office today and time spent on  visit including pre-visit chart review and post-visit care/coordination of care and electronic medical record documentation was 55 minutes. 50% of the time was in face to face counseling of this patient's medical condition(s) and providing education on treatment options to include the first-line treatment of diet and lifestyle modification.  I,Special Puri,acting as a Neurosurgeon for [employer], DO.,have documented all relevant documentation on the behalf of Thomasene Lot, DO,as directed by  Thomasene Lot, DO while in the presence of Thomasene Lot, DO.   I, Thomasene Lot, DO, have reviewed all documentation for this visit.  The documentation on 01/07/23 for the exam, diagnosis, procedures, and orders are all accurate and complete.

## 2023-01-10 ENCOUNTER — Ambulatory Visit: Payer: Medicaid Other

## 2023-01-10 DIAGNOSIS — R011 Cardiac murmur, unspecified: Secondary | ICD-10-CM

## 2023-01-11 LAB — ECHOCARDIOGRAM COMPLETE
AV Mean grad: 14 mmHg
AV Peak grad: 26 mmHg
Ao pk vel: 2.55 m/s
Area-P 1/2: 4.02 cm2
Calc EF: 55.9 %
P 1/2 time: 492 msec
S' Lateral: 2.9 cm
Single Plane A2C EF: 55.1 %
Single Plane A4C EF: 56.5 %

## 2023-01-21 ENCOUNTER — Other Ambulatory Visit: Payer: Self-pay | Admitting: Physician Assistant

## 2023-01-21 DIAGNOSIS — F17200 Nicotine dependence, unspecified, uncomplicated: Secondary | ICD-10-CM

## 2023-01-21 DIAGNOSIS — F339 Major depressive disorder, recurrent, unspecified: Secondary | ICD-10-CM

## 2023-01-21 NOTE — Telephone Encounter (Signed)
Please advise 

## 2023-01-27 NOTE — Progress Notes (Unsigned)
Complete physical exam   Patient: Sabrina Fuentes   DOB: 15-Mar-1971   52 y.o. Female  MRN: [id number] Visit Date: 01/28/2023  Today's healthcare provider: Debera Lat, PA-C   No chief complaint on file.  Subjective    Sabrina Fuentes is a 52 y.o. female who presents today for a complete physical exam.  She reports consuming a {diet types:17450} diet. {Exercise:19826} She generally feels {well/fairly well/poorly:18703}. She reports sleeping {well/fairly well/poorly:18703}. She {does/does not:200015} have additional problems to discuss today.  HPI  ***  Past Medical History:  Diagnosis Date   Abdominal hernia    Allergy    Back pain    Clotting disorder (HCC)    Depression    Elevated hemoglobin A1c    GERD (gastroesophageal reflux disease)    H/O blood clots    Heart murmur    Heartburn    Pre-diabetes    Sleep apnea    Past Surgical History:  Procedure Laterality Date   TUBAL LIGATION     Social History   Socioeconomic History   Marital status: Single    Spouse name: Not on file   Number of children: Not on file   Years of education: Not on file   Highest education level: GED or equivalent  Occupational History   Not on file  Tobacco Use   Smoking status: Every Day    Packs/day: .25    Types: Cigarettes   Smokeless tobacco: Never  Vaping Use   Vaping Use: Never used  Substance and Sexual Activity   Alcohol use: No   Drug use: No   Sexual activity: Yes  Other Topics Concern   Not on file  Social History Narrative   Not on file   Social Determinants of Health   Financial Resource Strain: Medium Risk (11/27/2022)   Overall Financial Resource Strain (CARDIA)    Difficulty of Paying Living Expenses: Somewhat hard  Food Insecurity: No Food Insecurity (11/27/2022)   Hunger Vital Sign    Worried About Running Out of Food in the Last Year: Never true    Ran Out of Food in the Last Year: Never true  Transportation Needs: No Transportation Needs  (11/27/2022)   PRAPARE - Administrator, Civil Service (Medical): No    Lack of Transportation (Non-Medical): No  Recent Concern: Transportation Needs - Unmet Transportation Needs (10/16/2022)   PRAPARE - Transportation    Lack of Transportation (Medical): Yes    Lack of Transportation (Non-Medical): Yes  Physical Activity: Unknown (11/27/2022)   Exercise Vital Sign    Days of Exercise per Week: 0 days    Minutes of Exercise per Session: Not on file  Stress: Stress Concern Present (11/27/2022)   [employer] of Occupational Health - Occupational Stress Questionnaire    Feeling of Stress : To some extent  Social Connections: Moderately Isolated (11/27/2022)   Social Connection and Isolation Panel [NHANES]    Frequency of Communication with Friends and Family: More than three times a week    Frequency of Social Gatherings with Friends and Family: Twice a week    Attends Religious Services: More than 4 times per year    Active Member of [employer] or Organizations: No    Attends Banker Meetings: Not on file    Marital Status: Separated  Intimate Partner Violence: Not At Risk (10/16/2022)   Humiliation, Afraid, Rape, and Kick questionnaire    Fear of Current or Ex-Partner: No  Emotionally Abused: No    Physically Abused: No    Sexually Abused: No   Family Status  Relation Name Status   Mother  Alive   Father  Alive   MGM  (Not Specified)   PGM  (Not Specified)   Mat Aunt  (Not Specified)   Emelda Brothers  (Not Specified)   Oneal Grout  (Not Specified)   Family History  Problem Relation Age of Onset   Diabetes Father    Arthritis Maternal Grandmother    Diabetes Paternal Grandmother    Diabetes Maternal Aunt    Diabetes Paternal Aunt    Diabetes Paternal Uncle    Allergies  Allergen Reactions   Aspirin    Iodinated Contrast Media Itching   Penicillins Hives    Pt allergic to all "cillins" >Has patient had a PCN reaction causing immediate rash,  facial/tongue/throat swelling, SOB or lightheadedness with hypotension: Yes Has patient had a PCN reaction causing severe rash involving mucus membranes or skin necrosis: patient is not sure  Has patient had a PCN reaction that required hospitalization {Yes/No:30480221} patient is not sure Has patient had a PCN reaction occurring within the last 10 years: No If all of the above answers are "NO", then may proceed wit    Patient Care Team: Debera Lat, PA-C as PCP - General (Physician Assistant) Debbe Odea, MD as PCP - Cardiology (Cardiology)   Medications: Outpatient Medications Prior to Visit  Medication Sig   buPROPion (WELLBUTRIN XL) 150 MG 24 hr tablet TAKE 1 TABLET(150 MG) BY MOUTH DAILY   Vitamin D, Ergocalciferol, (DRISDOL) 1.25 MG (50000 UNIT) CAPS capsule Take 1 capsule (50,000 Units total) by mouth every 7 (seven) days.   No facility-administered medications prior to visit.    Review of Systems  Constitutional: Negative.   HENT: Negative.    Eyes: Negative.   Respiratory: Negative.    Cardiovascular: Negative.   Gastrointestinal: Negative.   Endocrine: Negative.   Genitourinary: Negative.   Musculoskeletal: Negative.   Skin: Negative.   Allergic/Immunologic: Negative.   Neurological: Negative.   Hematological: Negative.   Psychiatric/Behavioral: Negative.      {Labs  Heme  Chem  Endocrine  Serology  Results Review (optional):23779}  Objective    There were no vitals taken for this visit. {Show previous vital signs (optional):23777}   Physical Exam  ***  Last depression screening scores    11/27/2022   10:08 AM 10/16/2022    9:16 AM  PHQ 2/9 Scores  PHQ - 2 Score 2 1  PHQ- 9 Score 7 10   Last fall risk screening    11/27/2022   10:08 AM  Fall Risk   Falls in the past year? 0  Number falls in past yr: 0  Injury with Fall? 0   Last Audit-C alcohol use screening    11/27/2022   10:07 AM  Alcohol Use Disorder Test (AUDIT)  1. How often  do you have a drink containing alcohol? 0  2. How many drinks containing alcohol do you have on a typical day when you are drinking? 0  3. How often do you have six or more drinks on one occasion? 0  AUDIT-C Score 0   A score of 3 or more in women, and 4 or more in men indicates increased risk for alcohol abuse, EXCEPT if all of the points are from question 1   No results found for any visits on 01/28/23.  Assessment & Plan    Routine Health  Maintenance and Physical Exam  Exercise Activities and Dietary recommendations  Goals   None     Immunization History  Administered Date(s) Administered   Influenza,inj,Quad PF,6+ Mos 10/16/2022    Health Maintenance  Topic Date Due   COVID-19 Vaccine (1) Never done   HIV Screening  Never done   Hepatitis C Screening  Never done   DTaP/Tdap/Td (1 - Tdap) Never done   PAP SMEAR-Modifier  Never done   Colonoscopy  Never done   INFLUENZA VACCINE  03/27/2023   HPV VACCINES  Aged Out    Discussed health benefits of physical activity, and encouraged her to engage in regular exercise appropriate for her age and condition.  ***  No follow-ups on file.     {provider attestation***:1}   Debera Lat, PA-C  [facility] [phone] (phone) [phone] (fax)  [facility]

## 2023-01-28 ENCOUNTER — Encounter: Payer: Self-pay | Admitting: Physician Assistant

## 2023-01-28 ENCOUNTER — Other Ambulatory Visit: Payer: Self-pay

## 2023-01-28 ENCOUNTER — Ambulatory Visit (INDEPENDENT_AMBULATORY_CARE_PROVIDER_SITE_OTHER): Payer: Medicaid Other | Admitting: Physician Assistant

## 2023-01-28 ENCOUNTER — Telehealth: Payer: Self-pay

## 2023-01-28 VITALS — BP 134/67 | HR 81 | Temp 98.1°F | Ht 62.52 in | Wt 287.1 lb

## 2023-01-28 DIAGNOSIS — R7303 Prediabetes: Secondary | ICD-10-CM | POA: Diagnosis not present

## 2023-01-28 DIAGNOSIS — Z1211 Encounter for screening for malignant neoplasm of colon: Secondary | ICD-10-CM

## 2023-01-28 DIAGNOSIS — F339 Major depressive disorder, recurrent, unspecified: Secondary | ICD-10-CM | POA: Diagnosis not present

## 2023-01-28 DIAGNOSIS — Z1159 Encounter for screening for other viral diseases: Secondary | ICD-10-CM

## 2023-01-28 DIAGNOSIS — K5909 Other constipation: Secondary | ICD-10-CM

## 2023-01-28 DIAGNOSIS — E559 Vitamin D deficiency, unspecified: Secondary | ICD-10-CM | POA: Diagnosis not present

## 2023-01-28 DIAGNOSIS — Z23 Encounter for immunization: Secondary | ICD-10-CM | POA: Diagnosis not present

## 2023-01-28 DIAGNOSIS — Z0000 Encounter for general adult medical examination without abnormal findings: Secondary | ICD-10-CM

## 2023-01-28 DIAGNOSIS — F17210 Nicotine dependence, cigarettes, uncomplicated: Secondary | ICD-10-CM | POA: Diagnosis not present

## 2023-01-28 DIAGNOSIS — E782 Mixed hyperlipidemia: Secondary | ICD-10-CM

## 2023-01-28 DIAGNOSIS — Z114 Encounter for screening for human immunodeficiency virus [HIV]: Secondary | ICD-10-CM

## 2023-01-28 DIAGNOSIS — M62838 Other muscle spasm: Secondary | ICD-10-CM

## 2023-01-28 MED ORDER — NA SULFATE-K SULFATE-MG SULF 17.5-3.13-1.6 GM/177ML PO SOLN: 1.0000 | Freq: Once | ORAL | 0 refills | Status: AC

## 2023-01-28 MED ORDER — BUPROPION HCL ER (SR) 100 MG PO TB12
100.0000 mg | ORAL_TABLET | Freq: Two times a day (BID) | ORAL | 0 refills | Status: DC
Start: 2023-01-28 — End: 2023-03-12

## 2023-01-28 NOTE — Telephone Encounter (Signed)
Pt aware insurance will end on 02-23-19

## 2023-01-28 NOTE — Telephone Encounter (Signed)
Gastroenterology Pre-Procedure Review  Request Date: 03/28/23 Requesting Physician: Dr. Allegra Lai  PATIENT REVIEW QUESTIONS: The patient responded to the following health history questions as indicated:    1. Are you having any GI issues? no 2. Do you have a personal history of Polyps? no 3. Do you have a family history of Colon Cancer or Polyps? no 4. Diabetes Mellitus? no 5. Joint replacements in the past 12 months?no 6. Major health problems in the past 3 months?no 7. Any artificial heart valves, MVP, or defibrillator?no    MEDICATIONS & ALLERGIES:    Patient reports the following regarding taking any anticoagulation/antiplatelet therapy:   Plavix, Coumadin, Eliquis, Xarelto, Lovenox, Pradaxa, Brilinta, or Effient? no Aspirin? no  Patient confirms/reports the following medications:  Current Outpatient Medications  Medication Sig Dispense Refill   Na Sulfate-K Sulfate-Mg Sulf 17.5-3.13-1.6 GM/177ML SOLN Take 1 kit by mouth once for 1 dose. 354 mL 0   buPROPion ER (WELLBUTRIN SR) 100 MG 12 hr tablet Take 1 tablet (100 mg total) by mouth 2 (two) times daily. 90 tablet 0   Vitamin D, Ergocalciferol, (DRISDOL) 1.25 MG (50000 UNIT) CAPS capsule Take 1 capsule (50,000 Units total) by mouth every 7 (seven) days. 4 capsule 0   No current facility-administered medications for this visit.    Patient confirms/reports the following allergies:    No orders of the defined types were placed in this encounter.   AUTHORIZATION INFORMATION Primary Insurance: 1D#: Group #:  Secondary Insurance: 1D#: Group #:  SCHEDULE INFORMATION: Date:  Time: Location:

## 2023-01-28 NOTE — Progress Notes (Signed)
Order is already in for colonoscopy. Opened in error.

## 2023-01-28 NOTE — Telephone Encounter (Signed)
Per Medicaid [employer]  pt plan will end on 02-23-2023  Ref  call number ref [phone] Pt states she will call Medicaid and let know if she will have a new plan or if she will move procedure date up before her policy ends.

## 2023-01-29 NOTE — Telephone Encounter (Signed)
Per patient mychart message, "I spoke with them everything should be good so we can keep the appointment. Thank you"  Colonoscopy will remain as scheduled for 03/28/23.   Thanks, [place], [place]

## 2023-01-29 NOTE — Telephone Encounter (Signed)
Thanks Misty Stanley- contacted patient.  We will keep her colonoscopy as scheduled right now.  She plans on contacting her insurance.  I've given her the option to move her procedure date up as well.  And informed her that she can mychart message me with an update once she has contacted in insurance.  Started a conversation with her in [place] letting her know after today I will be back in the office on Monday but she can mychart message me with updates and I will check back in with her on Monday.  Thanks, [place], [place]

## 2023-02-02 ENCOUNTER — Other Ambulatory Visit (INDEPENDENT_AMBULATORY_CARE_PROVIDER_SITE_OTHER): Payer: Self-pay | Admitting: Family Medicine

## 2023-02-02 DIAGNOSIS — E559 Vitamin D deficiency, unspecified: Secondary | ICD-10-CM

## 2023-02-03 ENCOUNTER — Ambulatory Visit (INDEPENDENT_AMBULATORY_CARE_PROVIDER_SITE_OTHER): Payer: Medicaid Other | Admitting: Family Medicine

## 2023-02-03 ENCOUNTER — Encounter (INDEPENDENT_AMBULATORY_CARE_PROVIDER_SITE_OTHER): Payer: Self-pay | Admitting: Family Medicine

## 2023-02-03 ENCOUNTER — Other Ambulatory Visit (INDEPENDENT_AMBULATORY_CARE_PROVIDER_SITE_OTHER): Payer: Self-pay | Admitting: Family Medicine

## 2023-02-03 VITALS — BP 122/72 | HR 83 | Temp 98.2°F | Ht 62.5 in | Wt 285.0 lb

## 2023-02-03 DIAGNOSIS — R7303 Prediabetes: Secondary | ICD-10-CM

## 2023-02-03 DIAGNOSIS — Z6841 Body Mass Index (BMI) 40.0 and over, adult: Secondary | ICD-10-CM | POA: Diagnosis not present

## 2023-02-03 DIAGNOSIS — E559 Vitamin D deficiency, unspecified: Secondary | ICD-10-CM | POA: Diagnosis not present

## 2023-02-03 MED ORDER — METFORMIN HCL 500 MG PO TABS: ORAL_TABLET | ORAL | 0 refills | Status: DC

## 2023-02-03 MED ORDER — VITAMIN D (ERGOCALCIFEROL) 1.25 MG (50000 UNIT) PO CAPS
50000.00 [IU] | ORAL_CAPSULE | ORAL | 0 refills | Status: DC
Start: 2023-02-03 — End: 2023-03-03

## 2023-02-03 NOTE — Progress Notes (Signed)
Sabrina Fuentes, D.O.  ABFM, ABOM Specializing in Clinical Bariatric Medicine  Office located at: [address] [place], [place]  [address]     Assessment and Plan:   Medications Discontinued During This Encounter  Medication Reason   Vitamin D, Ergocalciferol, (DRISDOL) 1.25 MG (50000 UNIT) CAPS capsule Reorder     Meds ordered this encounter  Medications   Vitamin D, Ergocalciferol, (DRISDOL) 1.25 MG (50000 UNIT) CAPS capsule    Sig: Take 1 capsule (50,000 Units total) by mouth every 7 (seven) days.    Dispense:  4 capsule    Refill:  0   metFORMIN (GLUCOPHAGE) 500 MG tablet    Sig: 1/2 tab twice daily with meals    Dispense:  30 tablet    Refill:  0     Prediabetes Assessment: Condition is not optimized. Patient endorses feeling hungry and having cravings for sweets, despite following the meal plan closely.   Lab Results  Component Value Date   HGBA1C 5.8 (H) 12/24/2022   HGBA1C 5.7 (H) 10/16/2022   INSULIN 13.0 12/24/2022   Plan: Begin Metformin. Start with 1/2 tab once daily with meals for 3-4 days, and increase to 1/2 tab twice daily if tolerating well. We discussed the possible risks and anticipated benefits of  Metformin, which can help [place] in the management of this disease process as well as with weight loss.    Sabrina Fuentes will continue to work on weight loss, exercise, via their meal plan we devised to help decrease the risk of progressing to diabetes.  We will recheck A1c and fasting insulin level in approximately 3 months from last check, or as deemed appropriate.   Also, we will check BMP,  b12 and mag in 2-3 mo after being on the metformin    Vitamin D deficiency Assessment: Condition is not optimized. Patient has been compliant and tolerant with Ergocalciferol 50K IU weekly. Denies any adverse effects.  Lab Results  Component Value Date   VD25OH 23.1 (L) 12/24/2022   Plan: Continue with supplement. Will reorder ERGO today.  Will continue to  monitor levels regularly (every 3-4 mo on average) to keep levels within normal limits and prevent over supplementation.   TREATMENT PLAN FOR OBESITY: BMI 50.0-59.9, adult (HCC) Morbid obesity (HCC) Assessment:  Sabrina Fuentes is here to discuss her progress with her obesity treatment plan along with follow-up of her obesity related diagnoses. See Medical Weight Management Flowsheet for complete bioelectrical impedance results.  Condition is not optimized.  Biometric data collected today, was reviewed with patient.   Since last office visit on 01/07/23 patient's  Muscle mass has decreased by 1.6 lb. Fat mass has increased by 1 lb. Total body water has increased by 1.2 lb.  Counseling done on how various foods will affect these numbers and how to maximize success  Total lbs lost to date: 4  Total weight loss percentage to date: 1.38   Plan:  - Continue the Category 3 meal plan with breakfast options and begin journaling 1700-1800 calories and 120+ grams of protein daily if she desires.   - Encouraged patient to journal her intake for increased mindfulness and awareness.  Showed pt how to accurately document her intake and use app.   Behavioral Intervention Additional resources provided today: Market researcher information and Metformin Handout , Food journaling log Evidence-based interventions for health behavior change were utilized today including the discussion of self monitoring techniques, problem-solving barriers and SMART goal setting techniques.  Regarding patient's less desirable eating habits and patterns, we employed the technique of small changes.  Pt will specifically work on: journaling her intake and bringing in her food log for next visit.    Recommended Physical Activity Goals  [name] has been advised to slowly work up to 150 minutes of moderate intensity aerobic activity a week and strengthening exercises 2-3 times per week for cardiovascular health, weight loss  maintenance and preservation of muscle mass.   She has agreed to Continue current level of physical activity   FOLLOW UP: Return in about 2 weeks (around 02/17/2023). She was informed of the importance of frequent follow up visits to maximize her success with intensive lifestyle modifications for her multiple health conditions.   Subjective:   Chief complaint: Obesity Sabrina Fuentes is here to discuss her progress with her obesity treatment plan. She is on the Category 3 Plan with breakfast options and states she is following her eating plan approximately 90% of the time. She states she is using the treadmill 20 minutes 3 days per week.  Interval History:  Sabrina Fuentes is here for a follow up office visit.     Since last office visit:    - Patient has been frustrated due to not seeing more weight loss.   - At lunch, she has 4-6 ounces of lean protein.  - At dinner, she has 8 ounces of lean protein and 2 cups of veggies.  - She rarely eats outside the home and has been avoiding fried foods.  - Endorses having hunger and cravings for sweets.   - Her PCP recently increased her Wellbutrin to 100 mg BID due to increase in depression symptoms. No SI. Per pt, mood is stable currently.  Still declines a need to see Dr Dewaine Conger at this time   Pharmacotherapy for weight loss: She is currently taking Wellbutrin for medical weight loss.  Denies side effects.    Review of Systems:  Pertinent positives were addressed with patient today.  Weight Summary and Biometrics   Weight Lost Since Last Visit: 1  Weight Gained Since Last Visit: 0    Vitals Temp: 98.2 F (36.8 C) BP: 122/72 Pulse Rate: 83 SpO2: 96 %   Anthropometric Measurements Height: 5' 2.5" (1.588 m) Weight: 285 lb (129.3 kg) BMI (Calculated): 51.26 Weight at Last Visit: 286lb Weight Lost Since Last Visit: 1 Weight Gained Since Last Visit: 0 Starting Weight: 289lb Total Weight Loss (lbs): 4 lb (1.814 kg) Peak Weight:  289lb   Body Composition  Body Fat %: 52.8 % Fat Mass (lbs): 151 lbs Muscle Mass (lbs): 128 lbs Total Body Water (lbs): 95.6 lbs Visceral Fat Rating : 20   Other Clinical Data Fasting: no Labs: no Today's Visit #: 3 Starting Date: 12/24/22   Objective:   PHYSICAL EXAM: Blood pressure 122/72, pulse 83, temperature 98.2 F (36.8 C), height 5' 2.5" (1.588 m), weight 285 lb (129.3 kg), last menstrual period 01/08/2023, SpO2 96 %. Body mass index is 51.3 kg/m.  General: Well Developed, well nourished, and in no acute distress.  HEENT: Normocephalic, atraumatic Skin: Warm and dry, cap RF less 2 sec, good turgor Chest:  Normal excursion, shape, no gross abn Respiratory: speaking in full sentences, no conversational dyspnea NeuroM-Sk: Ambulates w/o assistance, moves * 4 Psych: A and O *3, insight good, mood-full  DIAGNOSTIC DATA REVIEWED:  BMET    Component Value Date/Time   NA 137 12/24/2022 0940   NA 140 09/29/2014 1111   K 4.7  12/24/2022 0940   K 4.3 09/29/2014 1111   CL 101 12/24/2022 0940   CL 107 09/29/2014 1111   CO2 20 12/24/2022 0940   CO2 25 09/29/2014 1111   GLUCOSE 101 (H) 12/24/2022 0940   GLUCOSE 107 (H) 06/05/2020 2329   GLUCOSE 85 09/29/2014 1111   BUN 14 12/24/2022 0940   BUN 11 09/29/2014 1111   CREATININE 0.73 12/24/2022 0940   CREATININE 0.46 (L) 09/29/2014 1111   CALCIUM 9.0 12/24/2022 0940   CALCIUM 8.4 (L) 09/29/2014 1111   GFRNONAA >60 06/05/2020 2329   GFRNONAA >60 09/29/2014 1111   GFRNONAA >60 03/27/2014 1836   GFRAA >60 07/08/2017 0750   GFRAA >60 09/29/2014 1111   GFRAA >60 03/27/2014 1836   Lab Results  Component Value Date   HGBA1C 5.8 (H) 12/24/2022   HGBA1C 5.7 (H) 10/16/2022   Lab Results  Component Value Date   INSULIN 13.0 12/24/2022   Lab Results  Component Value Date   TSH 1.540 12/24/2022   CBC    Component Value Date/Time   WBC 4.3 12/24/2022 0940   WBC 8.2 06/05/2020 2329   RBC 4.76 12/24/2022 0940    RBC 4.17 06/05/2020 2329   HGB 14.5 12/24/2022 0940   HCT 43.6 12/24/2022 0940   PLT 336 12/24/2022 0940   MCV 92 12/24/2022 0940   MCV 82 10/03/2014 0821   MCH 30.5 12/24/2022 0940   MCH 31.7 06/05/2020 2329   MCHC 33.3 12/24/2022 0940   MCHC 33.6 06/05/2020 2329   RDW 12.9 12/24/2022 0940   RDW 16.1 (H) 10/03/2014 0821   Iron Studies No results found for: "IRON", "TIBC", "FERRITIN", "IRONPCTSAT" Lipid Panel     Component Value Date/Time   CHOL 226 (H) 12/24/2022 0940   TRIG 137 12/24/2022 0940   HDL 46 12/24/2022 0940   CHOLHDL 4.9 (H) 12/24/2022 0940   LDLCALC 155 (H) 12/24/2022 0940   Hepatic Function Panel     Component Value Date/Time   PROT 6.5 12/24/2022 0940   PROT 6.5 03/27/2014 1836   ALBUMIN 4.4 12/24/2022 0940   ALBUMIN 2.8 (L) 03/27/2014 1836   AST 14 12/24/2022 0940   AST 16 09/29/2014 1111   ALT 18 12/24/2022 0940   ALT 25 03/27/2014 1836   ALKPHOS 58 12/24/2022 0940   ALKPHOS 52 03/27/2014 1836   BILITOT 0.4 12/24/2022 0940   BILITOT 0.1 (L) 03/27/2014 1836      Component Value Date/Time   TSH 1.540 12/24/2022 0940   Nutritional Lab Results  Component Value Date   VD25OH 23.1 (L) 12/24/2022    Attestations:   Reviewed by clinician on day of visit: allergies, medications, problem list, medical history, surgical history, family history, social history, and previous encounter notes.   I, Special Randolm Idol, acting as a Stage manager for [employer], DO., have compiled all relevant documentation for today's office visit on behalf of Thomasene Lot, DO, while in the presence of [employer], DO.   I have reviewed the above documentation for accuracy and completeness, and I agree with the above. Sabrina Fuentes, D.O.   The 21st Century Cures Act was signed into law in 2016 which includes the topic of electronic health records.  This provides immediate access to information in MyChart.  This includes consultation notes, operative notes, office  notes, lab results and pathology reports.  If you have any questions about what you read please let us know at your next visit so we can discuss your concerns and  take corrective action if need be.  We are right here with you.

## 2023-02-11 ENCOUNTER — Ambulatory Visit (INDEPENDENT_AMBULATORY_CARE_PROVIDER_SITE_OTHER): Payer: Medicaid Other | Admitting: Family Medicine

## 2023-02-17 ENCOUNTER — Encounter: Payer: Self-pay | Admitting: Cardiology

## 2023-02-17 ENCOUNTER — Ambulatory Visit (INDEPENDENT_AMBULATORY_CARE_PROVIDER_SITE_OTHER): Payer: Medicaid Other | Admitting: Family Medicine

## 2023-02-17 ENCOUNTER — Encounter (INDEPENDENT_AMBULATORY_CARE_PROVIDER_SITE_OTHER): Payer: Self-pay | Admitting: Family Medicine

## 2023-02-17 ENCOUNTER — Ambulatory Visit: Payer: Medicaid Other | Attending: Cardiology | Admitting: Cardiology

## 2023-02-17 VITALS — BP 114/84 | HR 79 | Ht 62.0 in | Wt 284.8 lb

## 2023-02-17 VITALS — BP 105/70 | HR 86 | Temp 98.1°F | Ht 62.0 in | Wt 277.0 lb

## 2023-02-17 DIAGNOSIS — E782 Mixed hyperlipidemia: Secondary | ICD-10-CM | POA: Diagnosis not present

## 2023-02-17 DIAGNOSIS — I350 Nonrheumatic aortic (valve) stenosis: Secondary | ICD-10-CM

## 2023-02-17 DIAGNOSIS — E559 Vitamin D deficiency, unspecified: Secondary | ICD-10-CM | POA: Diagnosis not present

## 2023-02-17 DIAGNOSIS — Z6841 Body Mass Index (BMI) 40.0 and over, adult: Secondary | ICD-10-CM

## 2023-02-17 DIAGNOSIS — R7303 Prediabetes: Secondary | ICD-10-CM

## 2023-02-17 MED ORDER — METFORMIN HCL 500 MG PO TABS: ORAL_TABLET | ORAL | 0 refills | Status: DC

## 2023-02-17 NOTE — Patient Instructions (Signed)
Medication Instructions:   Your physician recommends that you continue on your current medications as directed. Please refer to the Current Medication list given to you today.  *If you need a refill on your cardiac medications before your next appointment, please call your pharmacy*   Lab Work:  None Ordered  If you have labs (blood work) drawn today and your tests are completely normal, you will receive your results only by: MyChart Message (if you have MyChart) OR A paper copy in the mail If you have any lab test that is abnormal or we need to change your treatment, we will call you to review the results.   Testing/Procedures:  None Ordered    Follow-Up: At [facility], you and your health needs are our priority.  As part of our continuing mission to provide you with exceptional heart care, we have created designated Provider Care Teams.  These Care Teams include your primary Cardiologist (physician) and Advanced Practice Providers (APPs -  Physician Assistants and Nurse Practitioners) who all work together to provide you with the care you need, when you need it.  We recommend signing up for the patient portal called "MyChart".  Sign up information is provided on this After Visit Summary.  MyChart is used to connect with patients for Virtual Visits (Telemedicine).  Patients are able to view lab/test results, encounter notes, upcoming appointments, etc.  Non-urgent messages can be sent to your provider as well.   To learn more about what you can do with MyChart, go to [email or web address].    Your next appointment:   12 month(s)  Provider:   You may see Brian Agbor-Etang, MD or one of the following Advanced Practice Providers on your designated Care Team:   Christopher Berge, NP Ryan Dunn, PA-C Cadence Furth, PA-C Sheri Hammock, NP  

## 2023-02-17 NOTE — Progress Notes (Signed)
Sabrina Fuentes, D.O.  ABFM, ABOM Specializing in Clinical Bariatric Medicine  Office located at: [address] [place], [place]  [address]     Assessment and Plan:   Medications Discontinued During This Encounter  Medication Reason   metFORMIN (GLUCOPHAGE) 500 MG tablet Reorder     Meds ordered this encounter  Medications   metFORMIN (GLUCOPHAGE) 500 MG tablet    Sig: 1 po bid with meals    Dispense:  60 tablet    Refill:  0    **Patient requests 90 days supply**     Vitamin D Deficiency Assessment: Condition is Not at goal. She continues to take ERGO 50K lU once weekly and is tolerating it well.   Lab Results  Component Value Date   VD25OH 23.1 (L) 12/24/2022   Plan: Continue with prescribed supplement. Pt denies a need for refill.    Weight loss will likely improve availability of vitamin D, thus encouraged Sabrina Fuentes to continue with meal plan and their weight loss efforts to further improve this condition.     Prediabetes Assessment: Condition is Not optimized. She continues Metformin 500 mg daily and takes half a tablet in the morning and 1/2 tab  before bedtime without difficulty. No N/V/D.  Pt states that she has been having carb cravings recently despite meeting her calorie and protein goals.   Lab Results  Component Value Date   HGBA1C 5.8 (H) 12/24/2022   HGBA1C 5.7 (H) 10/16/2022   INSULIN 13.0 12/24/2022    Plan: Increase Metformin dose to 1 PO BID and to eat it with meals.  Discussed anticipated benefits, possible risks of starting medication. All questions are answered appropriately and pt wishes to continue.   Continue to decrease simple carbs/ sugars; increase fiber and proteins -> follow her meal plan.   Sabrina Fuentes will continue to work on weight loss, exercise, via their meal plan we devised to help decrease the risk of progressing to diabetes. We will recheck A1c and fasting insulin level in approximately 3 months from last check, or as deemed  appropriate.    TREATMENT PLAN FOR OBESITY: BMI 50.0-59.9, adult (HCC)- current BMI 50.65  Morbid obesity (HCC)  Assessment:  Sabrina Fuentes is here to discuss her progress with her obesity treatment plan along with follow-up of her obesity related diagnoses. See Medical Weight Management Flowsheet for complete bioelectrical impedance results.  Condition is improving, but not optimized.   Since last office visit on 02/03/2023 patient's  Muscle mass has decreased by 2.6lb. Fat mass has decreased by 6.8lb. Total body water has decreased by 0.8lb.  Counseling done on how various foods will affect these numbers and how to maximize success  Total lbs lost to date: 12 Total weight loss percentage to date: 4.15   Plan:  Continue the Category 3 meal plan with breakfast options and continue journaling 1700-1800 calories and 120+ grams of protein daily if she desires.   - I instructed her to limit her consumption of red meat to only twice weekly. I recommended replacing ground beef with [place] and replace carbs with limited portion of brown or whole wheat grains.   - I also recommended pt to try Carba[employer]ada Pasta, [place] Sphagetti as an alternative to pasta.   Behavioral Intervention Additional resources provided today: lunch options Evidence-based interventions for health behavior change were utilized today including the discussion of self monitoring techniques, problem-solving barriers and SMART goal setting techniques.   Regarding patient's less desirable eating habits and  patterns, we employed the technique of small changes.  Pt will specifically work on: continue to increase her lean protein intake and decrease simple carbs for next visit.    Recommended Physical Activity Goals  Sabrina Fuentes has been advised to slowly work up to 150 minutes of moderate intensity aerobic activity a week and strengthening exercises 2-3 times per week for cardiovascular health, weight loss maintenance and preservation  of muscle mass.   She has agreed to Continue current level of physical activity   FOLLOW UP: Return in about 2 weeks (around 03/03/2023). She was informed of the importance of frequent follow up visits to maximize her success with intensive lifestyle modifications for her multiple health conditions.  Subjective:   Chief complaint: Obesity Sabrina Fuentes is here to discuss her progress with her obesity treatment plan. She is on the the Category 3 Plan and keeping a food journal and adhering to recommended goals of 1700-1800 calories and 120+ protein and states she is following her eating plan approximately 90% of the time. She states she is not exercising at the moment.   Interval History:  Sabrina Fuentes is here for a follow up office visit.   Since last OV,  she has been following the meal plan to the best of her ability but still experiences occasional cravings.  On most days, she has been eating 1500-1800 calories and 120+ grams of protein. She endorses "missing carbs", such as pasta and potatoes salad. She has not been able to exercise because of her busy work schedule.    Pharmacotherapy for weight loss: She is currently taking  Metformin and Wellbutrin   for medical weight loss.  Denies side effects.    Review of Systems:  Pertinent positives were addressed with patient today.  Reviewed by clinician on day of visit: allergies, medications, problem list, medical history, surgical history, family history, social history, and previous encounter notes.  Weight Summary and Biometrics   Weight Lost Since Last Visit: 8lb  Weight Gained Since Last Visit: 0   Vitals Temp: 98.1 F (36.7 C) BP: 105/70 Pulse Rate: 86 SpO2: 95 %   Anthropometric Measurements Height: 5\' 2"  (1.575 m) Weight: 277 lb (125.6 kg) BMI (Calculated): 50.65 Weight at Last Visit: 285lb Weight Lost Since Last Visit: 8lb Weight Gained Since Last Visit: 0 Starting Weight: 289lb Total Weight Loss (lbs): 12 lb (5.443  kg) Peak Weight: 289lb   Body Composition  Body Fat %: 52 % Fat Mass (lbs): 144.2 lbs Muscle Mass (lbs): 126.4 lbs Total Body Water (lbs): 94.8 lbs Visceral Fat Rating : 19   Other Clinical Data Fasting: no Labs: no Today's Visit #: 4 Starting Date: 12/24/22   Objective:   PHYSICAL EXAM: Blood pressure 105/70, pulse 86, temperature 98.1 F (36.7 C), height 5\' 2"  (1.575 m), weight 277 lb (125.6 kg), last menstrual period 02/08/2023, SpO2 95 %. Body mass index is 50.66 kg/m.  General: Well Developed, well nourished, and in no acute distress.  HEENT: Normocephalic, atraumatic Skin: Warm and dry, cap RF less 2 sec, good turgor Chest:  Normal excursion, shape, no gross abn Respiratory: speaking in full sentences, no conversational dyspnea NeuroM-Sk: Ambulates w/o assistance, moves * 4 Psych: A and O *3, insight good, mood-full  DIAGNOSTIC DATA REVIEWED:  BMET    Component Value Date/Time   NA 137 12/24/2022 0940   NA 140 09/29/2014 1111   K 4.7 12/24/2022 0940   K 4.3 09/29/2014 1111   CL 101 12/24/2022 0940   CL  107 09/29/2014 1111   CO2 20 12/24/2022 0940   CO2 25 09/29/2014 1111   GLUCOSE 101 (H) 12/24/2022 0940   GLUCOSE 107 (H) 06/05/2020 2329   GLUCOSE 85 09/29/2014 1111   BUN 14 12/24/2022 0940   BUN 11 09/29/2014 1111   CREATININE 0.73 12/24/2022 0940   CREATININE 0.46 (L) 09/29/2014 1111   CALCIUM 9.0 12/24/2022 0940   CALCIUM 8.4 (L) 09/29/2014 1111   GFRNONAA >60 06/05/2020 2329   GFRNONAA >60 09/29/2014 1111   GFRNONAA >60 03/27/2014 1836   GFRAA >60 07/08/2017 0750   GFRAA >60 09/29/2014 1111   GFRAA >60 03/27/2014 1836   Lab Results  Component Value Date   HGBA1C 5.8 (H) 12/24/2022   HGBA1C 5.7 (H) 10/16/2022   Lab Results  Component Value Date   INSULIN 13.0 12/24/2022   Lab Results  Component Value Date   TSH 1.540 12/24/2022   CBC    Component Value Date/Time   WBC 4.3 12/24/2022 0940   WBC 8.2 06/05/2020 2329   RBC 4.76  12/24/2022 0940   RBC 4.17 06/05/2020 2329   HGB 14.5 12/24/2022 0940   HCT 43.6 12/24/2022 0940   PLT 336 12/24/2022 0940   MCV 92 12/24/2022 0940   MCV 82 10/03/2014 0821   MCH 30.5 12/24/2022 0940   MCH 31.7 06/05/2020 2329   MCHC 33.3 12/24/2022 0940   MCHC 33.6 06/05/2020 2329   RDW 12.9 12/24/2022 0940   RDW 16.1 (H) 10/03/2014 0821   Iron Studies No results found for: "IRON", "TIBC", "FERRITIN", "IRONPCTSAT" Lipid Panel     Component Value Date/Time   CHOL 226 (H) 12/24/2022 0940   TRIG 137 12/24/2022 0940   HDL 46 12/24/2022 0940   CHOLHDL 4.9 (H) 12/24/2022 0940   LDLCALC 155 (H) 12/24/2022 0940   Hepatic Function Panel     Component Value Date/Time   PROT 6.5 12/24/2022 0940   PROT 6.5 03/27/2014 1836   ALBUMIN 4.4 12/24/2022 0940   ALBUMIN 2.8 (L) 03/27/2014 1836   AST 14 12/24/2022 0940   AST 16 09/29/2014 1111   ALT 18 12/24/2022 0940   ALT 25 03/27/2014 1836   ALKPHOS 58 12/24/2022 0940   ALKPHOS 52 03/27/2014 1836   BILITOT 0.4 12/24/2022 0940   BILITOT 0.1 (L) 03/27/2014 1836      Component Value Date/Time   TSH 1.540 12/24/2022 0940   Nutritional Lab Results  Component Value Date   VD25OH 23.1 (L) 12/24/2022    Attestations:   I, Sabrina Fuentes, acting as a Stage manager for [employer], DO., have compiled all relevant documentation for today's office visit on behalf of Sabrina Lot, DO, while in the presence of [employer], DO.  I have reviewed the above documentation for accuracy and completeness, and I agree with the above. Sabrina Fuentes, D.O.  The 21st Century Cures Act was signed into law in 2016 which includes the topic of electronic health records.  This provides immediate access to information in MyChart.  This includes consultation notes, operative notes, office notes, lab results and pathology reports.  If you have any questions about what you read please let us know at your next visit so we can discuss your concerns  and take corrective action if need be.  We are right here with you.

## 2023-02-17 NOTE — Progress Notes (Signed)
Cardiology Office Note:    Date:  02/17/2023   ID:  Sabrina Fuentes, DOB [birth year], MRN [id number]  PCP:  Debera Lat, PA-C   [facility] Providers Cardiologist:  Debbe Odea, MD     Referring MD: Debera Lat, PA-C   Chief Complaint  Patient presents with   Follow-up    Discuss 01/10/23 echo results.  Patient denies new or acute cardiac problems/concerns today.      History of Present Illness:    Sabrina Fuentes is a 52 y.o. female with a hx of morbid obesity, depression, former smoker presenting for follow-up.  Previously seen due to systolic murmur, echocardiogram was obtained to evaluate any significant structural abnormalities.  She quit smoking.  Establish care with the [facility], endorses eating high protein low-carb diet in order to help with weight loss.  Presents for echocardiogram results, has no concerns today.  Past Medical History:  Diagnosis Date   Abdominal hernia    Allergy    Back pain    Clotting disorder (HCC)    Depression    Elevated hemoglobin A1c    GERD (gastroesophageal reflux disease)    H/O blood clots    Heart murmur    Heartburn    Pre-diabetes    Sleep apnea     Past Surgical History:  Procedure Laterality Date   TUBAL LIGATION      Current Medications: Current Meds  Medication Sig   buPROPion ER (WELLBUTRIN SR) 100 MG 12 hr tablet Take 1 tablet (100 mg total) by mouth 2 (two) times daily.   metFORMIN (GLUCOPHAGE) 500 MG tablet TAKE 1/2 TABLET BY MOUTH TWICE DAILY WITH MEALS   Vitamin D, Ergocalciferol, (DRISDOL) 1.25 MG (50000 UNIT) CAPS capsule Take 1 capsule (50,000 Units total) by mouth every 7 (seven) days.     Allergies:   Aspirin, Iodinated contrast media, and Penicillins   Social History   Socioeconomic History   Marital status: Single    Spouse name: Not on file   Number of children: Not on file   Years of education: Not on file   Highest education level: GED or equivalent   Occupational History   Not on file  Tobacco Use   Smoking status: Every Day    Packs/day: .25    Types: Cigarettes   Smokeless tobacco: Never  Vaping Use   Vaping Use: Never used  Substance and Sexual Activity   Alcohol use: No   Drug use: No   Sexual activity: Yes  Other Topics Concern   Not on file  Social History Narrative   Not on file   Social Determinants of Health   Financial Resource Strain: Medium Risk (11/27/2022)   Overall Financial Resource Strain (CARDIA)    Difficulty of Paying Living Expenses: Somewhat hard  Food Insecurity: No Food Insecurity (11/27/2022)   Hunger Vital Sign    Worried About Running Out of Food in the Last Year: Never true    Ran Out of Food in the Last Year: Never true  Transportation Needs: No Transportation Needs (11/27/2022)   PRAPARE - Administrator, Civil Service (Medical): No    Lack of Transportation (Non-Medical): No  Recent Concern: Transportation Needs - Unmet Transportation Needs (10/16/2022)   PRAPARE - Transportation    Lack of Transportation (Medical): Yes    Lack of Transportation (Non-Medical): Yes  Physical Activity: Unknown (11/27/2022)   Exercise Vital Sign    Days of Exercise per Week: 0  days    Minutes of Exercise per Session: Not on file  Stress: Stress Concern Present (11/27/2022)   [employer] of Occupational Health - Occupational Stress Questionnaire    Feeling of Stress : To some extent  Social Connections: Moderately Isolated (11/27/2022)   Social Connection and Isolation Panel [NHANES]    Frequency of Communication with Friends and Family: More than three times a week    Frequency of Social Gatherings with Friends and Family: Twice a week    Attends Religious Services: More than 4 times per year    Active Member of [employer] or Organizations: No    Attends Engineer, structural: Not on file    Marital Status: Separated     Family History: The patient's family history includes Arthritis in her  maternal grandmother; Diabetes in her father, maternal aunt, paternal aunt, paternal grandmother, and paternal uncle.  ROS:   Please see the history of present illness.     All other systems reviewed and are negative.  EKGs/Labs/Other Studies Reviewed:    The following studies were reviewed today:   EKG:  EKG not  ordered today.   Recent Labs: 12/24/2022: ALT 18; BUN 14; Creatinine, Ser 0.73; Hemoglobin 14.5; Platelets 336; Potassium 4.7; Sodium 137; TSH 1.540  Recent Lipid Panel    Component Value Date/Time   CHOL 226 (H) 12/24/2022 0940   TRIG 137 12/24/2022 0940   HDL 46 12/24/2022 0940   CHOLHDL 4.9 (H) 12/24/2022 0940   LDLCALC 155 (H) 12/24/2022 0940     Risk Assessment/Calculations:             Physical Exam:    VS:  BP 114/84 (BP Location: Left Arm, Patient Position: Sitting, Cuff Size: Normal)   Pulse 79   Ht 5\' 2"  (1.575 m)   Wt 284 lb 12.8 oz (129.2 kg)   LMP 01/08/2023   SpO2 96%   BMI 52.09 kg/m     Wt Readings from Last 3 Encounters:  02/17/23 284 lb 12.8 oz (129.2 kg)  02/03/23 285 lb (129.3 kg)  01/28/23 287 lb 1.6 oz (130.2 kg)     GEN:  Well nourished, well developed in no acute distress HEENT: Normal NECK: No JVD; No carotid bruits CARDIAC: RRR, 2/6 systolic murmur RESPIRATORY: Diminished breath sounds bilaterally, no wheezing. ABDOMEN: Soft, non-tender, non-distended MUSCULOSKELETAL:  No edema; No deformity  SKIN: Warm and dry NEUROLOGIC:  Alert and oriented x 3 PSYCHIATRIC:  Normal affect   ASSESSMENT:    1. Aortic valve stenosis, etiology of cardiac valve disease unspecified   2. Morbid obesity (HCC)   3. Mixed hyperlipidemia    PLAN:    In order of problems listed above:  Mild aortic valve stenosis, EF 60 to 65%, aortic valve mean gradient 14 mmHg.  Monitor serially with echoes, repeat in 2 to 3 years. Morbid obesity, low-calorie diet, weight loss advised.  Hyperlipidemia, not in statin benefit group,.  Low-cholesterol diet  advised.  Follow-up in 1 year.     Medication Adjustments/Labs and Tests Ordered: Current medicines are reviewed at length with the patient today.  Concerns regarding medicines are outlined above.  No orders of the defined types were placed in this encounter.  No orders of the defined types were placed in this encounter.   Patient Instructions  Medication Instructions:   Your physician recommends that you continue on your current medications as directed. Please refer to the Current Medication list given to you today.  *If you need a  refill on your cardiac medications before your next appointment, please call your pharmacy*   Lab Work:  None Ordered  If you have labs (blood work) drawn today and your tests are completely normal, you will receive your results only by: MyChart Message (if you have MyChart) OR A paper copy in the mail If you have any lab test that is abnormal or we need to change your treatment, we will call you to review the results.   Testing/Procedures:  None Ordered    Follow-Up: At [facility], you and your health needs are our priority.  As part of our continuing mission to provide you with exceptional heart care, we have created designated Provider Care Teams.  These Care Teams include your primary Cardiologist (physician) and Advanced Practice Providers (APPs -  Physician Assistants and Nurse Practitioners) who all work together to provide you with the care you need, when you need it.  We recommend signing up for the patient portal called "MyChart".  Sign up information is provided on this After Visit Summary.  MyChart is used to connect with patients for Virtual Visits (Telemedicine).  Patients are able to view lab/test results, encounter notes, upcoming appointments, etc.  Non-urgent messages can be sent to your provider as well.   To learn more about what you can do with MyChart, go to [email or web address].    Your next appointment:   12  month(s)  Provider:   You may see Debbe Odea, MD or one of the following Advanced Practice Providers on your designated Care Team:   Nicolasa Ducking, NP Eula Listen, PA-C Cadence Fransico Michael, PA-C Charlsie Quest, NP   Signed, Debbe Odea, MD  02/17/2023 9:38 AM    [facility]

## 2023-02-25 ENCOUNTER — Other Ambulatory Visit: Payer: Self-pay | Admitting: Physician Assistant

## 2023-02-25 DIAGNOSIS — F17200 Nicotine dependence, unspecified, uncomplicated: Secondary | ICD-10-CM

## 2023-02-25 DIAGNOSIS — F339 Major depressive disorder, recurrent, unspecified: Secondary | ICD-10-CM

## 2023-02-26 ENCOUNTER — Telehealth: Payer: Medicaid Other | Admitting: Family Medicine

## 2023-02-26 DIAGNOSIS — L0292 Furuncle, unspecified: Secondary | ICD-10-CM

## 2023-02-26 MED ORDER — SULFAMETHOXAZOLE-TRIMETHOPRIM 800-160 MG PO TABS
1.0000 | ORAL_TABLET | Freq: Two times a day (BID) | ORAL | 0 refills | Status: AC
Start: 2023-02-26 — End: 2023-03-05

## 2023-02-26 NOTE — Progress Notes (Signed)
E Visit for Cellulitis/Boil  We are sorry that you are not feeling well. Here is how we plan to help!  Based on what you shared with me it looks like you have cellulitis.  Cellulitis looks like areas of skin redness, swelling, and warmth; it develops as a result of bacteria entering under the skin. Little red spots and/or bleeding can be seen in skin, and tiny surface sacs containing fluid can occur. Fever can be present. Cellulitis is almost always on one side of a body, and the lower limbs are the most common site of involvement.   I have prescribed:  Bactrim DS 1 tablet by mouth twice a day for 7 days  If you developed a fever or any of the symptoms below in the next 24-48 hours please be seen in person.  HOME CARE:  Take your medications as ordered and take all of them, even if the skin irritation appears to be healing.   GET HELP RIGHT AWAY IF:  Symptoms that don't begin to go away within 48 hours. Severe redness persists or worsens If the area turns color, spreads or swells. If it blisters and opens, develops yellow-brown crust or bleeds. You develop a fever or chills. If the pain increases or becomes unbearable.  Are unable to keep fluids and food down.  MAKE SURE YOU   Understand these instructions. Will watch your condition. Will get help right away if you are not doing well or get worse.  Thank you for choosing an e-visit.  Your e-visit answers were reviewed by a board certified advanced clinical practitioner to complete your personal care plan. Depending upon the condition, your plan could have included both over the counter or prescription medications.  Please review your pharmacy choice. Make sure the pharmacy is open so you can pick up prescription now. If there is a problem, you may contact your provider through [employer] and have the prescription routed to another pharmacy.  Your safety is important to [place]. If you have drug allergies check your prescription  carefully.   For the next 24 hours you can use MyChart to ask questions about today's visit, request a non-urgent call back, or ask for a work or school excuse. You will get an email in the next two days asking about your experience. I hope that your e-visit has been valuable and will speed your recovery.   I provided 5 minutes of non face-to-face time during this encounter for chart review, medication and order placement, as well as and documentation.

## 2023-03-01 ENCOUNTER — Other Ambulatory Visit (INDEPENDENT_AMBULATORY_CARE_PROVIDER_SITE_OTHER): Payer: Self-pay | Admitting: Family Medicine

## 2023-03-01 DIAGNOSIS — E559 Vitamin D deficiency, unspecified: Secondary | ICD-10-CM

## 2023-03-03 ENCOUNTER — Ambulatory Visit (INDEPENDENT_AMBULATORY_CARE_PROVIDER_SITE_OTHER): Payer: Medicaid Other | Admitting: Family Medicine

## 2023-03-03 ENCOUNTER — Encounter (INDEPENDENT_AMBULATORY_CARE_PROVIDER_SITE_OTHER): Payer: Self-pay | Admitting: Family Medicine

## 2023-03-03 ENCOUNTER — Other Ambulatory Visit (INDEPENDENT_AMBULATORY_CARE_PROVIDER_SITE_OTHER): Payer: Self-pay | Admitting: Family Medicine

## 2023-03-03 VITALS — BP 148/84 | HR 98 | Temp 98.1°F | Ht 62.0 in | Wt 277.0 lb

## 2023-03-03 DIAGNOSIS — Z6841 Body Mass Index (BMI) 40.0 and over, adult: Secondary | ICD-10-CM

## 2023-03-03 DIAGNOSIS — R7303 Prediabetes: Secondary | ICD-10-CM

## 2023-03-03 DIAGNOSIS — G479 Sleep disorder, unspecified: Secondary | ICD-10-CM

## 2023-03-03 DIAGNOSIS — E559 Vitamin D deficiency, unspecified: Secondary | ICD-10-CM | POA: Diagnosis not present

## 2023-03-03 MED ORDER — VITAMIN D (ERGOCALCIFEROL) 1.25 MG (50000 UNIT) PO CAPS
50000.0000 [IU] | ORAL_CAPSULE | ORAL | 0 refills | Status: DC
Start: 2023-03-03 — End: 2023-04-22

## 2023-03-03 MED ORDER — METFORMIN HCL 500 MG PO TABS: ORAL_TABLET | ORAL | 0 refills | Status: DC

## 2023-03-03 NOTE — Progress Notes (Signed)
Sabrina Fuentes, D.O.  ABFM, ABOM Specializing in Clinical Bariatric Medicine  Office located at: [address] [place], [place]  [address]     Assessment and Plan:   Orders Placed This Encounter  Procedures   Ambulatory referral to Sleep Studies    Medications Discontinued During This Encounter  Medication Reason   Vitamin D, Ergocalciferol, (DRISDOL) 1.25 MG (50000 UNIT) CAPS capsule Reorder   metFORMIN (GLUCOPHAGE) 500 MG tablet Reorder     Meds ordered this encounter  Medications   Vitamin D, Ergocalciferol, (DRISDOL) 1.25 MG (50000 UNIT) CAPS capsule    Sig: Take 1 capsule (50,000 Units total) by mouth every 7 (seven) days.    Dispense:  4 capsule    Refill:  0   metFORMIN (GLUCOPHAGE) 500 MG tablet    Sig: 1 po tid with meals    Dispense:  90 tablet    Refill:  0    Pt requests 30d supply.   30 d supply;  ** OV for RF **   Do not send RF request     Prediabetes Assessment: Her prediabetes is being treated with Metformin 1 po bid with meals. She is tolerating medication well and denies any N/V/D. She has not noticed any difference in her hunger and cravings since we increased her Metformin on 02/17/23.   Lab Results  Component Value Date   HGBA1C 5.8 (H) 12/24/2022   HGBA1C 5.7 (H) 10/16/2022   INSULIN 13.0 12/24/2022    Plan: Increase Metformin to TID. Discussed anticipated benefits, possible risks of increasing medication dose. Questions were encouraged and all were answered appropriately.  Continue her prudent nutritional plan that is low in simple carbohydrates, saturated fats and trans fats to goal of 5-10% weight loss to achieve significant health benefits.  Pt encouraged to continually advance exercise and cardiovascular fitness as tolerated throughout weight loss journey. Will continue to monitor condition closely alongside PCP.     Vitamin D deficiency Assessment: Condition is not optimized. She reports good compliance and tolerance with  Ergocalciferol 50K IU weekly. No concerns.   Lab Results  Component Value Date   VD25OH 23.1 (L) 12/24/2022   Plan: Continue with prescribed supplement at current dose. Will refill ERGO today.    We will continue to monitor levels regularly (every 3-4 mo on average) to keep levels within normal limits and prevent over supplementation.   Difficulty sleeping Assessment:  Sabrina Fuentes generally gets 4-6  hours of sleep per night, and states that she has generally unrestful sleep. She endorses snoring.  Plan: PT referred to Sleep study for evaluation of possible OSA.  Intensive lifestyle modifications are the first line treatment for this issue. We discussed several lifestyle modifications today and she will continue to work on diet, exercise and weight loss efforts. We will continue to monitor alongside PCP.   TREATMENT PLAN FOR OBESITY: BMI 50.0-59.9, adult (HCC) - current BMI 50.65 Morbid obesity (HCC) Assessment: Sabrina Fuentes is here to discuss her progress with her obesity treatment plan along with follow-up of her obesity related diagnoses. See Medical Weight Management Flowsheet for complete bioelectrical impedance results.  Condition is improving.  Since last office visit on 02/17/23 patient's  Muscle mass has increased by 3.4 lb. Fat mass has decreased by 3.2 lb. Total body water has decreased by 5.4 lb.  Counseling done on how various foods will affect these numbers and how to maximize success  Total lbs lost to date: 12  Total weight loss  percentage to date: 4.15  Plan: Continue the Category 3 meal plan with breakfast options and continue journaling 1700-1800 calories and 120+ grams of protein daily if she desires.   Behavioral Intervention Additional resources provided today: patient declined Evidence-based interventions for health behavior change were utilized today including the discussion of self monitoring techniques, problem-solving barriers and SMART goal setting  techniques.   Regarding patient's less desirable eating habits and patterns, we employed the technique of small changes.  Pt will specifically work on: continue with prescribed meal plan and journaling her intake for next visit.    Recommended Physical Activity Goals  Sabrina Fuentes has been advised to slowly work up to 150 minutes of moderate intensity aerobic activity a week and strengthening exercises 2-3 times per week for cardiovascular health, weight loss maintenance and preservation of muscle mass.   She has agreed to Continue current level of physical activity   FOLLOW UP: Return in about 2 weeks (around 03/17/2023). She was informed of the importance of frequent follow up visits to maximize her success with intensive lifestyle modifications for her multiple health conditions.  Subjective:   Chief complaint: Obesity Sabrina Fuentes is here to discuss her progress with her obesity treatment plan. She is on the the Category 3 Plan and keeping a food journal and adhering to recommended goals of 1700-1800 calories and 120+ protein and states she is following her eating plan approximately 90% of the time. She states she is not exercising.  Interval History:  Sabrina Fuentes is here for a follow up office visit. Since last OV, Sabrina Fuentes endorses dealing with challenging family stressors. Additionally, she reports having difficulty with sleeping.Last night, for example, she slept  around 4 hrs. She reports that journaling has been helpful and insightful, however she notes being under in calories and protein on most days.  Pharmacotherapy for weight loss: She is currently taking Wellbutrin and Metformin  for medical weight loss.  Denies side effects.    Review of Systems:  Pertinent positives were addressed with patient today.  Reviewed by clinician on day of visit: allergies, medications, problem list, medical history, surgical history, family history, social history, and previous encounter notes.  Weight  Summary and Biometrics   Weight Lost Since Last Visit: 0lb  Weight Gained Since Last Visit: 0lb    Vitals Temp: 98.1 F (36.7 C) BP: (!) 148/84 Pulse Rate: 98 SpO2: 96 %   Anthropometric Measurements Height: 5\' 2"  (1.575 m) Weight: 277 lb (125.6 kg) BMI (Calculated): 50.65 Weight at Last Visit: 277lb Weight Lost Since Last Visit: 0lb Weight Gained Since Last Visit: 0lb Starting Weight: 289lb Total Weight Loss (lbs): 12 lb (5.443 kg) Peak Weight: 289l   Body Composition  Body Fat %: 50.8 % Fat Mass (lbs): 141 lbs Muscle Mass (lbs): 129.8 lbs Total Body Water (lbs): 89.4 lbs Visceral Fat Rating : 19   Other Clinical Data Fasting: no Labs: no Today's Visit #: 5 Starting Date: 12/24/22   Objective:   PHYSICAL EXAM: Blood pressure (!) 148/84, pulse 98, temperature 98.1 F (36.7 C), height 5\' 2"  (1.575 m), weight 277 lb (125.6 kg), last menstrual period 02/08/2023, SpO2 96 %. Body mass index is 50.66 kg/m.  General: Well Developed, well nourished, and in no acute distress.  HEENT: Normocephalic, atraumatic Skin: Warm and dry, cap RF less 2 sec, good turgor Chest:  Normal excursion, shape, no gross abn Respiratory: speaking in full sentences, no conversational dyspnea NeuroM-Sk: Ambulates w/o assistance, moves * 4 Psych: A and O *  3, insight good, mood-full  DIAGNOSTIC DATA REVIEWED:  BMET    Component Value Date/Time   NA 137 12/24/2022 0940   NA 140 09/29/2014 1111   K 4.7 12/24/2022 0940   K 4.3 09/29/2014 1111   CL 101 12/24/2022 0940   CL 107 09/29/2014 1111   CO2 20 12/24/2022 0940   CO2 25 09/29/2014 1111   GLUCOSE 101 (H) 12/24/2022 0940   GLUCOSE 107 (H) 06/05/2020 2329   GLUCOSE 85 09/29/2014 1111   BUN 14 12/24/2022 0940   BUN 11 09/29/2014 1111   CREATININE 0.73 12/24/2022 0940   CREATININE 0.46 (L) 09/29/2014 1111   CALCIUM 9.0 12/24/2022 0940   CALCIUM 8.4 (L) 09/29/2014 1111   GFRNONAA >60 06/05/2020 2329   GFRNONAA >60  09/29/2014 1111   GFRNONAA >60 03/27/2014 1836   GFRAA >60 07/08/2017 0750   GFRAA >60 09/29/2014 1111   GFRAA >60 03/27/2014 1836   Lab Results  Component Value Date   HGBA1C 5.8 (H) 12/24/2022   HGBA1C 5.7 (H) 10/16/2022   Lab Results  Component Value Date   INSULIN 13.0 12/24/2022   Lab Results  Component Value Date   TSH 1.540 12/24/2022   CBC    Component Value Date/Time   WBC 4.3 12/24/2022 0940   WBC 8.2 06/05/2020 2329   RBC 4.76 12/24/2022 0940   RBC 4.17 06/05/2020 2329   HGB 14.5 12/24/2022 0940   HCT 43.6 12/24/2022 0940   PLT 336 12/24/2022 0940   MCV 92 12/24/2022 0940   MCV 82 10/03/2014 0821   MCH 30.5 12/24/2022 0940   MCH 31.7 06/05/2020 2329   MCHC 33.3 12/24/2022 0940   MCHC 33.6 06/05/2020 2329   RDW 12.9 12/24/2022 0940   RDW 16.1 (H) 10/03/2014 0821   Iron Studies No results found for: "IRON", "TIBC", "FERRITIN", "IRONPCTSAT" Lipid Panel     Component Value Date/Time   CHOL 226 (H) 12/24/2022 0940   TRIG 137 12/24/2022 0940   HDL 46 12/24/2022 0940   CHOLHDL 4.9 (H) 12/24/2022 0940   LDLCALC 155 (H) 12/24/2022 0940   Hepatic Function Panel     Component Value Date/Time   PROT 6.5 12/24/2022 0940   PROT 6.5 03/27/2014 1836   ALBUMIN 4.4 12/24/2022 0940   ALBUMIN 2.8 (L) 03/27/2014 1836   AST 14 12/24/2022 0940   AST 16 09/29/2014 1111   ALT 18 12/24/2022 0940   ALT 25 03/27/2014 1836   ALKPHOS 58 12/24/2022 0940   ALKPHOS 52 03/27/2014 1836   BILITOT 0.4 12/24/2022 0940   BILITOT 0.1 (L) 03/27/2014 1836      Component Value Date/Time   TSH 1.540 12/24/2022 0940   Nutritional Lab Results  Component Value Date   VD25OH 23.1 (L) 12/24/2022    Attestations:   I, Special Puri, acting as a Stage manager for Thomasene Lot, DO., have compiled all relevant documentation for today's office visit on behalf of Thomasene Lot, DO, while in the presence of [employer], DO.  I have reviewed the above documentation for  accuracy and completeness, and I agree with the above. Sabrina Fuentes, D.O.  The 21st Century Cures Act was signed into law in 2016 which includes the topic of electronic health records.  This provides immediate access to information in MyChart.  This includes consultation notes, operative notes, office notes, lab results and pathology reports.  If you have any questions about what you read please let us know at your next visit so we  can discuss your concerns and take corrective action if need be.  We are right here with you.

## 2023-03-09 NOTE — Progress Notes (Deleted)
Established patient visit  Patient: Sabrina Fuentes   DOB: 03/03/1971   52 y.o. Female  MRN: [id number] Visit Date: 03/11/2023  Today's healthcare provider: Debera Lat, PA-C   No chief complaint on file.  Subjective    HPI  *** Discussed the use of AI scribe software for clinical note transcription with the patient, who gave verbal consent to proceed.  History of Present Illness          6.24.24 Mild aortic valve stenosis, EF 60 to 65%, aortic valve mean gradient 14 mmHg.  Monitor serially with echoes, repeat in 2 to 3 years. Morbid obesity, low-calorie diet, weight loss advised.  Hyperlipidemia, not in statin benefit group,.  Low-cholesterol diet advised.    01/28/2023   11:22 AM 11/27/2022   10:08 AM 10/16/2022    9:16 AM  Depression screen PHQ 2/9  Decreased Interest 0 1 0  Down, Depressed, Hopeless 1 1 1   PHQ - 2 Score 1 2 1   Altered sleeping 0 2 3  Tired, decreased energy 3 2 3   Change in appetite 0 0 0  Feeling bad or failure about yourself  1 0 0  Trouble concentrating 1 1 3   Moving slowly or fidgety/restless 0 0 0  Suicidal thoughts 0 0 0  PHQ-9 Score 6 7 10   Difficult doing work/chores Very difficult Somewhat difficult Somewhat difficult       No data to display          Medications: Outpatient Medications Prior to Visit  Medication Sig   buPROPion ER (WELLBUTRIN SR) 100 MG 12 hr tablet Take 1 tablet (100 mg total) by mouth 2 (two) times daily.   metFORMIN (GLUCOPHAGE) 500 MG tablet 1 po tid with meals   Vitamin D, Ergocalciferol, (DRISDOL) 1.25 MG (50000 UNIT) CAPS capsule Take 1 capsule (50,000 Units total) by mouth every 7 (seven) days.   No facility-administered medications prior to visit.    Review of Systems  All other systems reviewed and are negative.  Except see HPI   {Insert previous labs (optional):23779}  {See past labs  Heme  Chem  Endocrine  Serology  Results Review (optional):1}   Objective    LMP 02/08/2023  {Insert  last BP/Wt (optional):23777}  {See vitals history (optional):1}  Physical Exam Vitals reviewed.  Constitutional:      General: She is not in acute distress.    Appearance: Normal appearance. She is well-developed. She is not diaphoretic.  HENT:     Head: Normocephalic and atraumatic.  Eyes:     General: No scleral icterus.    Conjunctiva/sclera: Conjunctivae normal.  Neck:     Thyroid: No thyromegaly.  Cardiovascular:     Rate and Rhythm: Normal rate and regular rhythm.     Pulses: Normal pulses.     Heart sounds: Normal heart sounds. No murmur heard. Pulmonary:     Effort: Pulmonary effort is normal. No respiratory distress.     Breath sounds: Normal breath sounds. No wheezing, rhonchi or rales.  Musculoskeletal:     Cervical back: Neck supple.     Right lower leg: No edema.     Left lower leg: No edema.  Lymphadenopathy:     Cervical: No cervical adenopathy.  Skin:    General: Skin is warm and dry.     Findings: No rash.  Neurological:     Mental Status: She is alert and oriented to person, place, and time. Mental status is at baseline.  Psychiatric:  Mood and Affect: Mood normal.        Behavior: Behavior normal.      No results found for any visits on 03/11/23.  Assessment & Plan    *** Assessment and Plan              No follow-ups on file.      [facility]

## 2023-03-11 ENCOUNTER — Ambulatory Visit: Payer: Medicaid Other | Admitting: Physician Assistant

## 2023-03-11 DIAGNOSIS — K5909 Other constipation: Secondary | ICD-10-CM

## 2023-03-11 DIAGNOSIS — M62838 Other muscle spasm: Secondary | ICD-10-CM

## 2023-03-11 DIAGNOSIS — R7303 Prediabetes: Secondary | ICD-10-CM

## 2023-03-11 DIAGNOSIS — E782 Mixed hyperlipidemia: Secondary | ICD-10-CM

## 2023-03-11 DIAGNOSIS — E559 Vitamin D deficiency, unspecified: Secondary | ICD-10-CM

## 2023-03-11 DIAGNOSIS — F339 Major depressive disorder, recurrent, unspecified: Secondary | ICD-10-CM

## 2023-03-12 ENCOUNTER — Other Ambulatory Visit: Payer: Self-pay | Admitting: Physician Assistant

## 2023-03-12 DIAGNOSIS — F339 Major depressive disorder, recurrent, unspecified: Secondary | ICD-10-CM

## 2023-03-17 ENCOUNTER — Ambulatory Visit (INDEPENDENT_AMBULATORY_CARE_PROVIDER_SITE_OTHER): Payer: Medicaid Other | Admitting: Family Medicine

## 2023-03-17 ENCOUNTER — Other Ambulatory Visit: Payer: Self-pay | Admitting: Physician Assistant

## 2023-03-17 DIAGNOSIS — F339 Major depressive disorder, recurrent, unspecified: Secondary | ICD-10-CM

## 2023-03-18 NOTE — Telephone Encounter (Signed)
Unable to refill per protocol, Rx request is too soon. Last refill 03/12/23 for 90 days.  Requested Prescriptions  Pending Prescriptions Disp Refills   buPROPion ER (WELLBUTRIN SR) 100 MG 12 hr tablet [Pharmacy Med Name: BUPROPION SR 100MG  TABLETS (12 H)] 90 tablet 0    Sig: TAKE 1 TABLET(100 MG) BY MOUTH TWICE DAILY     Psychiatry: Antidepressants - bupropion Failed - 03/17/2023  8:04 AM      Failed - Last BP in normal range    BP Readings from Last 1 Encounters:  03/03/23 (!) 148/84         Passed - Cr in normal range and within 360 days    Creatinine  Date Value Ref Range Status  09/29/2014 0.46 (L) 0.60 - 1.30 mg/dL Final   Creatinine, Ser  Date Value Ref Range Status  12/24/2022 0.73 0.57 - 1.00 mg/dL Final         Passed - AST in normal range and within 360 days    AST  Date Value Ref Range Status  12/24/2022 14 0 - 40 IU/L Final   SGOT(AST)  Date Value Ref Range Status  09/29/2014 16 15 - 37 Unit/L Final         Passed - ALT in normal range and within 360 days    ALT  Date Value Ref Range Status  12/24/2022 18 0 - 32 IU/L Final   SGPT (ALT)  Date Value Ref Range Status  03/27/2014 25 U/L Final    Comment:    14-63 NOTE: New Reference Range 03/15/14          Passed - Completed PHQ-2 or PHQ-9 in the last 360 days      Passed - Valid encounter within last 6 months    Recent Outpatient Visits           1 month ago Colon cancer screening   [place] [facility] [place], [place], PA-C   3 months ago Prediabetes   [place] [facility] [place], [place], PA-C   4 months ago COVID-19   [facility] [place], [place], PA-C   5 months ago Morbid obesity [facility])   [place] [facility] [place], [place], PA-C       Future Appointments             [place], Edmon Crape, PA-C [facility] [facility], [place]

## 2023-03-19 ENCOUNTER — Ambulatory Visit: Payer: Medicaid Other | Admitting: Physician Assistant

## 2023-03-19 ENCOUNTER — Encounter: Payer: Self-pay | Admitting: Family Medicine

## 2023-03-19 ENCOUNTER — Ambulatory Visit (INDEPENDENT_AMBULATORY_CARE_PROVIDER_SITE_OTHER): Payer: Medicaid Other | Admitting: Family Medicine

## 2023-03-19 VITALS — BP 147/95 | HR 84 | Ht 64.0 in | Wt 281.7 lb

## 2023-03-19 DIAGNOSIS — E662 Morbid (severe) obesity with alveolar hypoventilation: Secondary | ICD-10-CM

## 2023-03-19 DIAGNOSIS — F4389 Other reactions to severe stress: Secondary | ICD-10-CM

## 2023-03-19 DIAGNOSIS — K2100 Gastro-esophageal reflux disease with esophagitis, without bleeding: Secondary | ICD-10-CM

## 2023-03-19 DIAGNOSIS — I10 Essential (primary) hypertension: Secondary | ICD-10-CM | POA: Diagnosis not present

## 2023-03-19 DIAGNOSIS — R21 Rash and other nonspecific skin eruption: Secondary | ICD-10-CM

## 2023-03-19 DIAGNOSIS — R7303 Prediabetes: Secondary | ICD-10-CM

## 2023-03-19 DIAGNOSIS — F17290 Nicotine dependence, other tobacco product, uncomplicated: Secondary | ICD-10-CM

## 2023-03-19 DIAGNOSIS — E782 Mixed hyperlipidemia: Secondary | ICD-10-CM

## 2023-03-19 MED ORDER — CLOBETASOL PROPIONATE 0.05 % EX OINT
1.0000 | TOPICAL_OINTMENT | Freq: Two times a day (BID) | CUTANEOUS | 0 refills | Status: DC
Start: 2023-03-19 — End: 2023-12-12

## 2023-03-19 MED ORDER — BUPROPION HCL ER (XL) 300 MG PO TB24
300.0000 mg | ORAL_TABLET | Freq: Every day | ORAL | 3 refills | Status: DC
Start: 2023-03-19 — End: 2023-09-01

## 2023-03-19 MED ORDER — TRIAMCINOLONE ACETONIDE 0.5 % EX OINT: 1.0000 | TOPICAL_OINTMENT | Freq: Two times a day (BID) | CUTANEOUS | 0 refills | Status: DC

## 2023-03-19 MED ORDER — ROSUVASTATIN CALCIUM 10 MG PO TABS: 10.0000 mg | ORAL_TABLET | Freq: Every day | ORAL | 3 refills | Status: DC

## 2023-03-19 MED ORDER — BUPROPION HCL ER (XL) 150 MG PO TB24
150.0000 mg | ORAL_TABLET | Freq: Every day | ORAL | 3 refills | Status: DC
Start: 2023-03-19 — End: 2023-07-30

## 2023-03-19 MED ORDER — PANTOPRAZOLE SODIUM 40 MG PO TBEC
40.0000 mg | DELAYED_RELEASE_TABLET | Freq: Every day | ORAL | 0 refills | Status: DC
Start: 2023-03-19 — End: 2023-06-16

## 2023-03-19 MED ORDER — LOSARTAN POTASSIUM-HCTZ 50-12.5 MG PO TABS: 1.0000 | ORAL_TABLET | Freq: Every day | ORAL | 3 refills | Status: DC

## 2023-03-19 NOTE — Patient Instructions (Addendum)
The 10-year ASCVD risk score (Arnett DK, et al., 2019) is: 6.5%   Values used to calculate the score:     Age: 52 years     Sex: Female     Is Non-Hispanic African American: No     Diabetic: No     Tobacco smoker: Yes     Systolic Blood Pressure: 147 mmHg     Is BP treated: No     HDL Cholesterol: 46 mg/dL     Total Cholesterol: 226 mg/dL  Change wellbutrin from 100 mg twice daily to 200 mg twice daily. When current supply is completed; change to 150+300 for total of 450 mg

## 2023-03-19 NOTE — Progress Notes (Signed)
Established patient visit   Patient: Sabrina Fuentes   DOB: [birth year]   52 y.o. Female  MRN: [id number] Visit Date: 03/19/2023  Today's healthcare provider: Jacky Kindle, FNP  Introduced to nurse practitioner role and practice setting.  All questions answered.  Discussed provider/patient relationship and expectations.   Chief Complaint  Patient presents with   Rash    Patient reports hand rash present on both hands for 2 years now that comes out more during the summer and usually not associated with pain or itching but in the last few weeks she has noticed it spreading more the more she is outside and now associated with some itching.    Medical Management of Chronic Issues    Being managed at weight loss clinic but Fuentes not be able to continue to go due to distance and how often she would have to be seen.   New Med Request    Patient reports she usually takes [employer] version of prilosec for heartburn but over the last few weeks she has noticed it becoming worse regardless of what she is eating or drinking and would like to see if she can have a medication for GERD   Subjective    Rash   HPI     Rash    Additional comments: Patient reports hand rash present on both hands for 2 years now that comes out more during the summer and usually not associated with pain or itching but in the last few weeks she has noticed it spreading more the more she is outside and now associated with some itching.         Medical Management of Chronic Issues    Additional comments: Being managed at weight loss clinic but Fuentes not be able to continue to go due to distance and how often she would have to be seen.        New Med Request    Additional comments: Patient reports she usually takes [employer] version of prilosec for heartburn but over the last few weeks she has noticed it becoming worse regardless of what she is eating or drinking and would like to see if she can have a medication for  GERD      Last edited by Acey Lav, CMA on 03/19/2023 10:31 AM.      Medications: Outpatient Medications Prior to Visit  Medication Sig   metFORMIN (GLUCOPHAGE) 500 MG tablet 1 po tid with meals   Vitamin D, Ergocalciferol, (DRISDOL) 1.25 MG (50000 UNIT) CAPS capsule Take 1 capsule (50,000 Units total) by mouth every 7 (seven) days.   [DISCONTINUED] buPROPion ER (WELLBUTRIN SR) 100 MG 12 hr tablet TAKE 1 TABLET(100 MG) BY MOUTH TWICE DAILY   No facility-administered medications prior to visit.    Review of Systems  Skin:  Positive for rash.    Last CBC Lab Results  Component Value Date   WBC 4.3 12/24/2022   HGB 14.5 12/24/2022   HCT 43.6 12/24/2022   MCV 92 12/24/2022   MCH 30.5 12/24/2022   RDW 12.9 12/24/2022   PLT 336 12/24/2022   Last metabolic panel Lab Results  Component Value Date   GLUCOSE 101 (H) 12/24/2022   NA 137 12/24/2022   K 4.7 12/24/2022   CL 101 12/24/2022   CO2 20 12/24/2022   BUN 14 12/24/2022   CREATININE 0.73 12/24/2022   EGFR 101 12/24/2022   CALCIUM 9.0 12/24/2022   PROT 6.5 12/24/2022   ALBUMIN  4.4 12/24/2022   LABGLOB 2.1 12/24/2022   AGRATIO 2.1 12/24/2022   BILITOT 0.4 12/24/2022   ALKPHOS 58 12/24/2022   AST 14 12/24/2022   ALT 18 12/24/2022   ANIONGAP 8 06/05/2020   Last lipids Lab Results  Component Value Date   CHOL 226 (H) 12/24/2022   HDL 46 12/24/2022   LDLCALC 155 (H) 12/24/2022   TRIG 137 12/24/2022   CHOLHDL 4.9 (H) 12/24/2022   Last hemoglobin A1c Lab Results  Component Value Date   HGBA1C 5.8 (H) 12/24/2022   Last thyroid functions Lab Results  Component Value Date   TSH 1.540 12/24/2022   T3TOTAL 116 12/24/2022   Last vitamin D Lab Results  Component Value Date   VD25OH 23.1 (L) 12/24/2022   Last vitamin B12 and Folate Lab Results  Component Value Date   VITAMINB12 641 12/24/2022      Objective    BP (!) 147/95 (BP Location: Right Arm, Patient Position: Sitting, Cuff Size: Large)    Pulse 84   Ht 5\' 4"  (1.626 m)   Wt 281 lb 11.2 oz (127.8 kg)   LMP 02/08/2023   SpO2 99%   BMI 48.35 kg/m  BP Readings from Last 3 Encounters:  03/19/23 (!) 147/95  03/03/23 (!) 148/84  02/17/23 105/70   Wt Readings from Last 3 Encounters:  03/19/23 281 lb 11.2 oz (127.8 kg)  03/03/23 277 lb (125.6 kg)  02/17/23 277 lb (125.6 kg)   SpO2 Readings from Last 3 Encounters:  03/19/23 99%  03/03/23 96%  02/17/23 95%   Physical Exam Vitals and nursing note reviewed.  Constitutional:      General: She is not in acute distress.    Appearance: Normal appearance. She is obese. She is not ill-appearing, toxic-appearing or diaphoretic.  HENT:     Head: Normocephalic and atraumatic.  Cardiovascular:     Rate and Rhythm: Normal rate and regular rhythm.     Pulses: Normal pulses.     Heart sounds: Normal heart sounds. No murmur heard.    No friction rub. No gallop.  Pulmonary:     Effort: Pulmonary effort is normal. No respiratory distress.     Breath sounds: Normal breath sounds. No stridor. No wheezing, rhonchi or rales.  Chest:     Chest wall: No tenderness.  Abdominal:     General: Bowel sounds are normal.     Palpations: Abdomen is soft.  Musculoskeletal:        General: No swelling, tenderness, deformity or signs of injury. Normal range of motion.     Right lower leg: No edema.     Left lower leg: No edema.  Skin:    General: Skin is warm and dry.     Capillary Refill: Capillary refill takes less than 2 seconds.     Coloration: Skin is not jaundiced or pale.     Findings: No bruising, erythema, lesion or rash.  Neurological:     General: No focal deficit present.     Mental Status: She is alert and oriented to person, place, and time. Mental status is at baseline.     Cranial Nerves: No cranial nerve deficit.     Sensory: No sensory deficit.     Motor: No weakness.     Coordination: Coordination normal.  Psychiatric:        Mood and Affect: Mood normal.         Behavior: Behavior normal.        Thought Content:  Thought content normal.        Judgment: Judgment normal.    No results found for any visits on 03/19/23.  Assessment & Plan     Problem List Items Addressed This Visit       Cardiovascular and Mediastinum   Primary hypertension    Chronic, elevated Recommend start of hyzaar to assist 1 month f/u recommended       Relevant Medications   rosuvastatin (CRESTOR) 10 MG tablet   losartan-hydrochlorothiazide (HYZAAR) 50-12.5 MG tablet     Digestive   Gastroesophageal reflux disease with esophagitis without hemorrhage    Chronic, worsening No longer controlled with prilosec Fuentes increase strength of PPI Continue to monitor diet/posture etc       Relevant Medications   pantoprazole (PROTONIX) 40 MG tablet     Musculoskeletal and Integument   Rash of both hands - Primary    Acute on chronic, aggravating Recommend use of  topical steroidal cream to assist       Relevant Medications   clobetasol ointment (TEMOVATE) 0.05 %     Other   Adjustment reaction to chronic stress    Stressors iso teenage daughter; trial of wellbutrin to assist 6-8 week f/u recommended Monitor for s/s of nausea       Relevant Medications   buPROPion (WELLBUTRIN XL) 150 MG 24 hr tablet   buPROPion (WELLBUTRIN XL) 300 MG 24 hr tablet   Mixed hyperlipidemia -new diagnosis    recommend diet low in saturated fat and regular exercise - 30 min at least 5 times per week The 10-year ASCVD risk score (Arnett DK, et al., 2019) is: 8.8% Recommend start of crestor 10 mg with labs in 3-6 months       Relevant Medications   rosuvastatin (CRESTOR) 10 MG tablet   losartan-hydrochlorothiazide (HYZAAR) 50-12.5 MG tablet   Morbid obesity (HCC)    Chronic, Body mass index is 48.35 kg/m. Discussed importance of healthy weight management Discussed diet and exercise       Relevant Medications   buPROPion (WELLBUTRIN XL) 150 MG 24 hr tablet   buPROPion  (WELLBUTRIN XL) 300 MG 24 hr tablet   Obesity hypoventilation syndrome (HCC)    Chronic, has known OSA as well Recommend continued use of CPAP to assist as well as wedge pillow vs use of blocking to assist both OSA/OHS and GERD; Fuentes resend for updated PSG Continue to monitor      Relevant Orders   PSG Sleep Study   Prediabetes    Chronic, stable Repeat A1c at 3 months Continue to recommend balanced, lower carb meals. Smaller meal size, adding snacks. Choosing water as drink of choice and increasing purposeful exercise. Continues on metformin 500 mg with meals (TID)       Vaping nicotine dependence, tobacco product    Chronic, stable Low use per pt report Continue to monitor correlation with obesity, breathing concerns, HTN and HLD Pt remains pre contemplative at this time regarding cessation efforts       Return in about 6 weeks (around 04/30/2023).     Leilani Merl, FNP, have reviewed all documentation for this visit. The documentation on 03/26/23 for the exam, diagnosis, procedures, and orders are all accurate and complete.  Jacky Kindle, FNP  [facility] [phone] (phone) [phone] (fax)  [facility]

## 2023-03-20 ENCOUNTER — Telehealth: Payer: Self-pay

## 2023-03-20 ENCOUNTER — Telehealth: Payer: Self-pay | Admitting: Gastroenterology

## 2023-03-20 NOTE — Telephone Encounter (Signed)
Patient called in to cancel their colonoscopy.

## 2023-03-20 NOTE — Telephone Encounter (Signed)
Returned patients call to let her know message was received. She has requested to cancel her 03/28/23 colonoscopy due to her daughter will be out of town.  She will call back to reschedule.  Sabrina Fuentes in Endo has been notified.  Thanks,  [place], [place]

## 2023-03-26 ENCOUNTER — Encounter: Payer: Self-pay | Admitting: Family Medicine

## 2023-03-26 DIAGNOSIS — F4389 Other reactions to severe stress: Secondary | ICD-10-CM | POA: Insufficient documentation

## 2023-03-26 DIAGNOSIS — I10 Essential (primary) hypertension: Secondary | ICD-10-CM | POA: Insufficient documentation

## 2023-03-26 DIAGNOSIS — E662 Morbid (severe) obesity with alveolar hypoventilation: Secondary | ICD-10-CM | POA: Insufficient documentation

## 2023-03-26 DIAGNOSIS — R21 Rash and other nonspecific skin eruption: Secondary | ICD-10-CM | POA: Insufficient documentation

## 2023-03-26 NOTE — Assessment & Plan Note (Signed)
Chronic, stable Repeat A1c at 3 months Continue to recommend balanced, lower carb meals. Smaller meal size, adding snacks. Choosing water as drink of choice and increasing purposeful exercise. Continues on metformin 500 mg with meals (TID)

## 2023-03-26 NOTE — Assessment & Plan Note (Signed)
Chronic, has known OSA as well Recommend continued use of CPAP to assist as well as wedge pillow vs use of blocking to assist both OSA/OHS and GERD; will resend for updated PSG Continue to monitor

## 2023-03-26 NOTE — Assessment & Plan Note (Signed)
Chronic, Body mass index is 48.35 kg/m. Discussed importance of healthy weight management Discussed diet and exercise

## 2023-03-26 NOTE — Assessment & Plan Note (Signed)
Chronic, elevated Recommend start of hyzaar to assist 1 month f/u recommended

## 2023-03-26 NOTE — Assessment & Plan Note (Signed)
Stressors iso teenage daughter; trial of wellbutrin to assist 6-8 week f/u recommended Monitor for s/s of nausea

## 2023-03-26 NOTE — Assessment & Plan Note (Signed)
recommend diet low in saturated fat and regular exercise - 30 min at least 5 times per week The 10-year ASCVD risk score (Arnett DK, et al., 2019) is: 8.8% Recommend start of crestor 10 mg with labs in 3-6 months

## 2023-03-26 NOTE — Assessment & Plan Note (Signed)
Chronic, worsening No longer controlled with prilosec Will increase strength of PPI Continue to monitor diet/posture etc

## 2023-03-26 NOTE — Assessment & Plan Note (Signed)
Acute on chronic, aggravating Recommend use of  topical steroidal cream to assist

## 2023-03-26 NOTE — Assessment & Plan Note (Signed)
Chronic, stable Low use per pt report Continue to monitor correlation with obesity, breathing concerns, HTN and HLD Pt remains pre contemplative at this time regarding cessation efforts

## 2023-03-28 ENCOUNTER — Ambulatory Visit: Admission: RE | Admit: 2023-03-28 | Payer: Medicaid Other | Source: Home / Self Care | Admitting: Gastroenterology

## 2023-03-28 ENCOUNTER — Other Ambulatory Visit (INDEPENDENT_AMBULATORY_CARE_PROVIDER_SITE_OTHER): Payer: Self-pay | Admitting: Family Medicine

## 2023-03-28 ENCOUNTER — Encounter: Admission: RE | Payer: Self-pay | Source: Home / Self Care

## 2023-03-28 DIAGNOSIS — E559 Vitamin D deficiency, unspecified: Secondary | ICD-10-CM

## 2023-03-28 SURGERY — COLONOSCOPY WITH PROPOFOL
Anesthesia: General

## 2023-03-30 ENCOUNTER — Other Ambulatory Visit (INDEPENDENT_AMBULATORY_CARE_PROVIDER_SITE_OTHER): Payer: Self-pay | Admitting: Family Medicine

## 2023-04-01 ENCOUNTER — Ambulatory Visit (INDEPENDENT_AMBULATORY_CARE_PROVIDER_SITE_OTHER): Payer: Medicaid Other | Admitting: Family Medicine

## 2023-04-08 DIAGNOSIS — G4733 Obstructive sleep apnea (adult) (pediatric): Secondary | ICD-10-CM | POA: Diagnosis not present

## 2023-04-22 ENCOUNTER — Ambulatory Visit (INDEPENDENT_AMBULATORY_CARE_PROVIDER_SITE_OTHER): Payer: Medicaid Other | Admitting: Family Medicine

## 2023-04-22 ENCOUNTER — Encounter: Payer: Self-pay | Admitting: Family Medicine

## 2023-04-22 VITALS — BP 117/73 | HR 78 | Ht 64.0 in | Wt 280.0 lb

## 2023-04-22 DIAGNOSIS — F17290 Nicotine dependence, other tobacco product, uncomplicated: Secondary | ICD-10-CM

## 2023-04-22 DIAGNOSIS — R7303 Prediabetes: Secondary | ICD-10-CM | POA: Diagnosis not present

## 2023-04-22 DIAGNOSIS — E662 Morbid (severe) obesity with alveolar hypoventilation: Secondary | ICD-10-CM

## 2023-04-22 DIAGNOSIS — E559 Vitamin D deficiency, unspecified: Secondary | ICD-10-CM

## 2023-04-22 DIAGNOSIS — F4389 Other reactions to severe stress: Secondary | ICD-10-CM

## 2023-04-22 DIAGNOSIS — R21 Rash and other nonspecific skin eruption: Secondary | ICD-10-CM

## 2023-04-22 DIAGNOSIS — I10 Essential (primary) hypertension: Secondary | ICD-10-CM

## 2023-04-22 MED ORDER — METFORMIN HCL ER 750 MG PO TB24: 750.0000 mg | ORAL_TABLET | Freq: Every day | ORAL | 1 refills | Status: DC

## 2023-04-22 MED ORDER — VITAMIN D (ERGOCALCIFEROL) 1.25 MG (50000 UNIT) PO CAPS
50000.0000 [IU] | ORAL_CAPSULE | ORAL | 0 refills | Status: DC
Start: 2023-04-22 — End: 2023-06-27

## 2023-04-22 NOTE — Assessment & Plan Note (Signed)
Initial PSG completed; needs titration study given severity of disease process Planned for 2-3 weeks

## 2023-04-22 NOTE — Assessment & Plan Note (Signed)
Chronic, unchanged Continue to recommend complete cessation efforts

## 2023-04-22 NOTE — Assessment & Plan Note (Signed)
Chronic, stable Reassurance provided that pt has NOT gained with added stress Continue to work on healthier choices to assist Continue to recommend balanced, lower carb meals. Smaller meal size, adding snacks. Choosing water as drink of choice and increasing purposeful exercise.

## 2023-04-22 NOTE — Assessment & Plan Note (Signed)
Continue supplementation x3 months; repeat labs at 6 month window

## 2023-04-22 NOTE — Assessment & Plan Note (Signed)
Chronic, likely stable given use of 1500 mg metformin IR daily and previous A1c at 5.8% Decrease to 750 mg XR given complaints of nausea after eating to reduce possible low blood sugars

## 2023-04-22 NOTE — Assessment & Plan Note (Signed)
Chronic, worsening; reports some added stressors with another family member; worsening nausea; has tried to adjust meals/med times to assist  Referral to SW to further assist

## 2023-04-22 NOTE — Progress Notes (Signed)
Established patient visit   Patient: Sabrina Fuentes   DOB: 1971/02/13   52 y.o. Female  MRN: [id number] Visit Date: 04/22/2023  Today's healthcare provider: Jacky Kindle, FNP  Introduced to nurse practitioner role and practice setting.  All questions answered.  Discussed provider/patient relationship and expectations.  Subjective    HPI HPI     Follow-up    Additional comments: Concerns for weight and the metformin has not caused issues besides dizziness possible headaches from it, follow up with cream that was prescribed. Needs prescription for vit D      Last edited by Clois Comber on 04/22/2023 11:00 AM.      Medications: Outpatient Medications Prior to Visit  Medication Sig   buPROPion (WELLBUTRIN XL) 150 MG 24 hr tablet Take 1 tablet (150 mg total) by mouth daily.   buPROPion (WELLBUTRIN XL) 300 MG 24 hr tablet Take 1 tablet (300 mg total) by mouth daily.   clobetasol ointment (TEMOVATE) 0.05 % Apply 1 Application topically 2 (two) times daily. Max of 10 days in a row; use a pea size amount.   losartan-hydrochlorothiazide (HYZAAR) 50-12.5 MG tablet Take 1 tablet by mouth daily.   pantoprazole (PROTONIX) 40 MG tablet Take 1 tablet (40 mg total) by mouth daily.   rosuvastatin (CRESTOR) 10 MG tablet Take 1 tablet (10 mg total) by mouth daily.   triamcinolone ointment (KENALOG) 0.5 % Apply 1 Application topically 2 (two) times daily.   [DISCONTINUED] metFORMIN (GLUCOPHAGE) 500 MG tablet 1 po tid with meals   [DISCONTINUED] Vitamin D, Ergocalciferol, (DRISDOL) 1.25 MG (50000 UNIT) CAPS capsule Take 1 capsule (50,000 Units total) by mouth every 7 (seven) days. (Patient not taking: Reported on 04/22/2023)   No facility-administered medications prior to visit.     Objective    BP 117/73 (BP Location: Left Arm, Patient Position: Sitting, Cuff Size: Large)   Pulse 78   Ht 5\' 4"  (1.626 m)   Wt 280 lb (127 kg)   SpO2 94%   BMI 48.06 kg/m   Physical Exam Vitals and  nursing note reviewed.  Constitutional:      General: She is not in acute distress.    Appearance: Normal appearance. She is obese. She is not ill-appearing, toxic-appearing or diaphoretic.  Cardiovascular:     Rate and Rhythm: Normal rate and regular rhythm.     Pulses: Normal pulses.     Heart sounds: Normal heart sounds. No murmur heard.    No friction rub. No gallop.  Pulmonary:     Effort: Pulmonary effort is normal. No respiratory distress.     Breath sounds: Normal breath sounds. No stridor. No wheezing, rhonchi or rales.  Chest:     Chest wall: No tenderness.  Musculoskeletal:        General: No swelling, tenderness, deformity or signs of injury. Normal range of motion.     Right lower leg: No edema.     Left lower leg: No edema.  Skin:    General: Skin is warm and dry.     Capillary Refill: Capillary refill takes less than 2 seconds.     Coloration: Skin is not jaundiced or pale.     Findings: No bruising, erythema, lesion or rash.  Neurological:     General: No focal deficit present.     Mental Status: She is alert and oriented to person, place, and time. Mental status is at baseline.     Cranial Nerves: No cranial nerve deficit.  Sensory: No sensory deficit.     Motor: No weakness.     Coordination: Coordination normal.  Psychiatric:        Mood and Affect: Mood normal.        Behavior: Behavior normal.        Thought Content: Thought content normal.        Judgment: Judgment normal.     No results found for any visits on 04/22/23.  Assessment & Plan     Problem List Items Addressed This Visit       Cardiovascular and Mediastinum   Primary hypertension     Musculoskeletal and Integument   Rash of both hands - Primary    Chronic; has not improved with topical creams Referral to derm to assist  F/u as needed      Relevant Orders   Ambulatory referral to Dermatology     Other   Adjustment reaction to chronic stress    Chronic, worsening; reports  some added stressors with another family member; worsening nausea; has tried to adjust meals/med times to assist  Referral to SW to further assist       Relevant Orders   AMB Referral to Managed Medicaid Care Management   Morbid obesity (HCC)    Chronic, stable Reassurance provided that pt has NOT gained with added stress Continue to work on healthier choices to assist Continue to recommend balanced, lower carb meals. Smaller meal size, adding snacks. Choosing water as drink of choice and increasing purposeful exercise.       Relevant Medications   metFORMIN (GLUCOPHAGE-XR) 750 MG 24 hr tablet   Obesity hypoventilation syndrome (HCC)    Initial PSG completed; needs titration study given severity of disease process Planned for 2-3 weeks       Relevant Medications   metFORMIN (GLUCOPHAGE-XR) 750 MG 24 hr tablet   Prediabetes    Chronic, likely stable given use of 1500 mg metformin IR daily and previous A1c at 5.8% Decrease to 750 mg XR given complaints of nausea after eating to reduce possible low blood sugars       Vaping nicotine dependence, tobacco product    Chronic, unchanged Continue to recommend complete cessation efforts       Vitamin D deficiency - new diagnoses    Continue supplementation x3 months; repeat labs at 6 month window      Relevant Medications   Vitamin D, Ergocalciferol, (DRISDOL) 1.25 MG (50000 UNIT) CAPS capsule   Return in about 8 weeks (around 06/17/2023) for chonic disease management.     Leilani Merl, FNP, have reviewed all documentation for this visit. The documentation on 04/22/23 for the exam, diagnosis, procedures, and orders are all accurate and complete.  Jacky Kindle, FNP  [facility] [phone] (phone) [phone] (fax)  [facility]

## 2023-04-22 NOTE — Assessment & Plan Note (Signed)
Chronic; has not improved with topical creams Referral to derm to assist  F/u as needed

## 2023-04-29 ENCOUNTER — Other Ambulatory Visit: Payer: Medicaid Other | Admitting: Licensed Clinical Social Worker

## 2023-04-29 NOTE — Patient Instructions (Signed)
Alease Frame ,   [facility] Managed Care Team is available to provide assistance to you with your healthcare needs at no cost and as a benefit of your [facility] plan. I'm sorry I was unable to reach you today for our scheduled appointment. Our care guide will call you to reschedule our telephone appointment. Please call me at the number below. I am available to be of assistance to you regarding your healthcare needs. .   Thank you,   Dickie La, BSW, MSW, LCSW Managed Medicaid LCSW [facility]  [address] [email or web address] Phone: [phone]

## 2023-04-29 NOTE — Patient Outreach (Signed)
  Medicaid Managed Care   Unsuccessful Attempt Note   04/29/2023 Name: Sabrina Fuentes MRN: [id number] DOB: 1971-04-08  Referred by: Debera Lat, PA-C Reason for referral : No chief complaint on file.   An unsuccessful telephone outreach was attempted today. The patient was referred to the case management team for assistance with care management and care coordination.    Follow Up Plan: A HIPAA compliant phone message was left for the patient providing contact information and requesting a return call. and The Managed Medicaid care management team will reach out to the patient again over the next 30 days.   Dickie La, BSW, MSW, [employer] Managed Medicaid LCSW [facility]  [facility] [email or web address] Phone: [phone]

## 2023-04-30 NOTE — Addendum Note (Signed)
Addended by: Sande Brothers on: 04/30/2023 12:10 PM   Modules accepted: Orders

## 2023-05-02 ENCOUNTER — Other Ambulatory Visit (INDEPENDENT_AMBULATORY_CARE_PROVIDER_SITE_OTHER): Payer: Self-pay | Admitting: Family Medicine

## 2023-05-05 ENCOUNTER — Telehealth: Payer: Self-pay

## 2023-05-05 NOTE — Telephone Encounter (Signed)
..   Medicaid Managed Care   Unsuccessful Outreach Note  05/05/2023 Name: Sabrina Fuentes MRN: [id number] DOB: Aug 17, 1971  Referred by: Debera Lat, PA-C Reason for referral : Appointment (I called the patient today to get her rescheduled for her missed phone appointment with the MM LCSW. Patient stated she was at work and she would have to call me back.)   A second unsuccessful telephone outreach was attempted today. The patient was referred to the case management team for assistance with care management and care coordination.   Follow Up Plan: The care management team will reach out to the patient again over the next 7 days.   Weston Settle Care Guide  [facility] Managed  Care Guide [facility]  [phone]

## 2023-05-30 DIAGNOSIS — M13861 Other specified arthritis, right knee: Secondary | ICD-10-CM | POA: Diagnosis not present

## 2023-05-30 DIAGNOSIS — M1711 Unilateral primary osteoarthritis, right knee: Secondary | ICD-10-CM | POA: Diagnosis not present

## 2023-05-30 DIAGNOSIS — M25561 Pain in right knee: Secondary | ICD-10-CM | POA: Diagnosis not present

## 2023-06-04 DIAGNOSIS — L309 Dermatitis, unspecified: Secondary | ICD-10-CM | POA: Diagnosis not present

## 2023-06-04 DIAGNOSIS — L920 Granuloma annulare: Secondary | ICD-10-CM | POA: Diagnosis not present

## 2023-06-14 ENCOUNTER — Other Ambulatory Visit: Payer: Self-pay | Admitting: Family Medicine

## 2023-06-14 DIAGNOSIS — K2100 Gastro-esophageal reflux disease with esophagitis, without bleeding: Secondary | ICD-10-CM

## 2023-06-16 NOTE — Telephone Encounter (Signed)
Requested Prescriptions  Pending Prescriptions Disp Refills   pantoprazole (PROTONIX) 40 MG tablet [Pharmacy Med Name: PANTOPRAZOLE 40MG  TABLETS] 90 tablet 0    Sig: TAKE 1 TABLET(40 MG) BY MOUTH DAILY     Gastroenterology: Proton Pump Inhibitors Passed - 06/14/2023  6:44 AM      Passed - Valid encounter within last 12 months    Recent Outpatient Visits           1 month ago Rash of both hands   [facility] [facility] Jacky Kindle, FNP   2 months ago Rash of both hands   [facility] Jacky Kindle, FNP   4 months ago Colon cancer screening   [place] [facility] [place], [place], PA-C   6 months ago Prediabetes   [facility] [place], [place], PA-C   7 months ago COVID-19   [facility] [place], [place], [place]

## 2023-06-17 ENCOUNTER — Other Ambulatory Visit: Payer: Self-pay | Admitting: Family Medicine

## 2023-06-17 DIAGNOSIS — K2100 Gastro-esophageal reflux disease with esophagitis, without bleeding: Secondary | ICD-10-CM

## 2023-06-18 DIAGNOSIS — L920 Granuloma annulare: Secondary | ICD-10-CM | POA: Diagnosis not present

## 2023-06-18 DIAGNOSIS — Z4802 Encounter for removal of sutures: Secondary | ICD-10-CM | POA: Diagnosis not present

## 2023-06-25 ENCOUNTER — Other Ambulatory Visit: Payer: Self-pay | Admitting: Family Medicine

## 2023-06-25 DIAGNOSIS — E559 Vitamin D deficiency, unspecified: Secondary | ICD-10-CM

## 2023-06-26 ENCOUNTER — Encounter: Payer: Self-pay | Admitting: Family Medicine

## 2023-06-26 ENCOUNTER — Ambulatory Visit: Payer: Medicaid Other | Admitting: Family Medicine

## 2023-06-26 VITALS — BP 114/70 | HR 88 | Ht 64.0 in | Wt 287.0 lb

## 2023-06-26 DIAGNOSIS — Z23 Encounter for immunization: Secondary | ICD-10-CM | POA: Insufficient documentation

## 2023-06-26 DIAGNOSIS — I10 Essential (primary) hypertension: Secondary | ICD-10-CM

## 2023-06-26 DIAGNOSIS — R7303 Prediabetes: Secondary | ICD-10-CM | POA: Diagnosis not present

## 2023-06-26 DIAGNOSIS — Z1211 Encounter for screening for malignant neoplasm of colon: Secondary | ICD-10-CM

## 2023-06-26 DIAGNOSIS — E782 Mixed hyperlipidemia: Secondary | ICD-10-CM

## 2023-06-26 DIAGNOSIS — E559 Vitamin D deficiency, unspecified: Secondary | ICD-10-CM

## 2023-06-26 MED ORDER — PHENTERMINE HCL 37.5 MG PO CAPS: 37.5000 mg | ORAL_CAPSULE | ORAL | 0 refills | Status: DC

## 2023-06-26 MED ORDER — NALTREXONE HCL 50 MG PO TABS: 50.0000 mg | ORAL_TABLET | Freq: Every day | ORAL | 0 refills | Status: DC

## 2023-06-26 NOTE — Progress Notes (Signed)
Established patient visit   Patient: Sabrina Fuentes   DOB: 1971/05/19   52 y.o. Female  MRN: [id number] Visit Date: 06/26/2023  Today's healthcare provider: Jacky Kindle, FNP  Re Introduced to nurse practitioner role and practice setting.  All questions answered.  Discussed provider/patient relationship and expectations.  Chief Complaint  Patient presents with   Follow-up    Sleep studies f/u and weight problem.   Subjective    HPI HPI     Follow-up    Additional comments: Sleep studies f/u and weight problem.      Last edited by Shelly Bombard, CMA on 06/26/2023  1:08 PM.      Medications: Outpatient Medications Prior to Visit  Medication Sig   diclofenac (VOLTAREN) 75 MG EC tablet Take 1 tablet by mouth 2 (two) times daily with a meal.   buPROPion (WELLBUTRIN XL) 150 MG 24 hr tablet Take 1 tablet (150 mg total) by mouth daily.   buPROPion (WELLBUTRIN XL) 300 MG 24 hr tablet Take 1 tablet (300 mg total) by mouth daily.   clobetasol ointment (TEMOVATE) 0.05 % Apply 1 Application topically 2 (two) times daily. Max of 10 days in a row; use a pea size amount.   losartan-hydrochlorothiazide (HYZAAR) 50-12.5 MG tablet Take 1 tablet by mouth daily.   metFORMIN (GLUCOPHAGE-XR) 750 MG 24 hr tablet Take 1 tablet (750 mg total) by mouth daily with breakfast.   pantoprazole (PROTONIX) 40 MG tablet TAKE 1 TABLET(40 MG) BY MOUTH DAILY   rosuvastatin (CRESTOR) 10 MG tablet Take 1 tablet (10 mg total) by mouth daily.   triamcinolone ointment (KENALOG) 0.5 % Apply 1 Application topically 2 (two) times daily.   Vitamin D, Ergocalciferol, (DRISDOL) 1.25 MG (50000 UNIT) CAPS capsule Take 1 capsule (50,000 Units total) by mouth every 7 (seven) days.   No facility-administered medications prior to visit.     Objective    BP 114/70 (BP Location: Right Arm, Patient Position: Sitting, Cuff Size: Large)   Pulse 88   Ht 5\' 4"  (1.626 m)   Wt 287 lb (130.2 kg)   SpO2 97%   BMI 49.26  kg/m   Physical Exam Vitals and nursing note reviewed.  Constitutional:      General: She is not in acute distress.    Appearance: Normal appearance. She is obese. She is not ill-appearing, toxic-appearing or diaphoretic.  HENT:     Head: Normocephalic and atraumatic.  Cardiovascular:     Rate and Rhythm: Normal rate and regular rhythm.     Pulses: Normal pulses.     Heart sounds: Normal heart sounds. No murmur heard.    No friction rub. No gallop.  Pulmonary:     Effort: Pulmonary effort is normal. No respiratory distress.     Breath sounds: Normal breath sounds. No stridor. No wheezing, rhonchi or rales.  Chest:     Chest wall: No tenderness.  Musculoskeletal:        General: No swelling, tenderness, deformity or signs of injury. Normal range of motion.     Right lower leg: No edema.     Left lower leg: No edema.  Skin:    General: Skin is warm and dry.     Capillary Refill: Capillary refill takes less than 2 seconds.     Coloration: Skin is not jaundiced or pale.     Findings: No bruising, erythema, lesion or rash.  Neurological:     General: No focal deficit present.  Mental Status: She is alert and oriented to person, place, and time. Mental status is at baseline.     Cranial Nerves: No cranial nerve deficit.     Sensory: No sensory deficit.     Motor: No weakness.     Coordination: Coordination normal.  Psychiatric:        Mood and Affect: Mood normal.        Behavior: Behavior normal.        Thought Content: Thought content normal.        Judgment: Judgment normal.     No results found for any visits on 06/26/23.  Assessment & Plan     Problem List Items Addressed This Visit       Cardiovascular and Mediastinum   Primary hypertension   Relevant Orders   Basic Metabolic Panel (BMET)     Other   Colon cancer screening    Recommend cologaurd to assist with single parent/need for childcare for screening for special need pre-teen      Relevant Orders    Cologuard   Encounter for immunization - Primary   Relevant Orders   Flu vaccine trivalent PF, 6mos and older(Flulaval,Afluria,Fluarix,Fluzone) (Completed)   Mixed hyperlipidemia -new diagnosis    Chronic, stable Repeat LP recommend diet low in saturated fat and regular exercise - 30 min at least 5 times per week Continues on crestor 10 mg Working on smoking reduction       Relevant Orders   Lipid panel   Morbid obesity (HCC)    Chronic, worsening Body mass index is 49.26 kg/m. Discussed importance of healthy weight management Discussed diet and exercise       Relevant Medications   phentermine 37.5 MG capsule   Prediabetes    Chronic, repeat A1c On 750 xr metformin to assist Continue to recommend balanced, lower carb meals. Smaller meal size, adding snacks. Choosing water as drink of choice and increasing purposeful exercise.      Relevant Orders   Hemoglobin A1c   Vitamin D deficiency - new diagnoses    Chronic, stable Repeat labs; on high dose supplement       Relevant Orders   Vitamin D (25 hydroxy)   Return in about 3 months (around 09/26/2023) for weight mgmt .     Leilani Merl, FNP, have reviewed all documentation for this visit. The documentation on 06/26/23 for the exam, diagnosis, procedures, and orders are all accurate and complete.  Jacky Kindle, FNP  [facility] [phone] (phone) [phone] (fax)  [facility]

## 2023-06-26 NOTE — Assessment & Plan Note (Signed)
Recommend cologaurd to assist with single parent/need for childcare for screening for special need pre-teen

## 2023-06-26 NOTE — Assessment & Plan Note (Signed)
Chronic, worsening Body mass index is 49.26 kg/m. Discussed importance of healthy weight management Discussed diet and exercise

## 2023-06-26 NOTE — Assessment & Plan Note (Signed)
Chronic, stable Repeat labs; on high dose supplement

## 2023-06-26 NOTE — Assessment & Plan Note (Signed)
Chronic, repeat A1c On 750 xr metformin to assist Continue to recommend balanced, lower carb meals. Smaller meal size, adding snacks. Choosing water as drink of choice and increasing purposeful exercise.

## 2023-06-26 NOTE — Assessment & Plan Note (Signed)
Chronic, stable Repeat LP recommend diet low in saturated fat and regular exercise - 30 min at least 5 times per week Continues on crestor 10 mg Working on smoking reduction

## 2023-06-27 ENCOUNTER — Other Ambulatory Visit: Payer: Self-pay | Admitting: Family Medicine

## 2023-06-27 DIAGNOSIS — E559 Vitamin D deficiency, unspecified: Secondary | ICD-10-CM

## 2023-06-27 DIAGNOSIS — E782 Mixed hyperlipidemia: Secondary | ICD-10-CM

## 2023-06-27 LAB — BASIC METABOLIC PANEL
BUN/Creatinine Ratio: 23 (ref 9–23)
BUN: 20 mg/dL (ref 6–24)
CO2: 26 mmol/L (ref 20–29)
Calcium: 9.5 mg/dL (ref 8.7–10.2)
Chloride: 101 mmol/L (ref 96–106)
Creatinine, Ser: 0.87 mg/dL (ref 0.57–1.00)
Glucose: 88 mg/dL (ref 70–99)
Potassium: 5.1 mmol/L (ref 3.5–5.2)
Sodium: 140 mmol/L (ref 134–144)
eGFR: 82 mL/min/{1.73_m2} (ref >59)

## 2023-06-27 LAB — LIPID PANEL
Chol/HDL Ratio: 3.3 ratio (ref 0.0–4.4)
Cholesterol, Total: 167 mg/dL (ref 100–199)
HDL: 50 mg/dL (ref >39)
LDL Chol Calc (NIH): 93 mg/dL (ref 0–99)
Triglycerides: 134 mg/dL (ref 0–149)
VLDL Cholesterol Cal: 24 mg/dL (ref 5–40)

## 2023-06-27 LAB — HEMOGLOBIN A1C
Est. average glucose Bld gHb Est-mCnc: 120 mg/dL
Hgb A1c MFr Bld: 5.8 % — ABNORMAL HIGH (ref 4.8–5.6)

## 2023-06-27 LAB — VITAMIN D 25 HYDROXY (VIT D DEFICIENCY, FRACTURES): Vit D, 25-Hydroxy: 42.6 ng/mL (ref 30.0–100.0)

## 2023-06-27 MED ORDER — ROSUVASTATIN CALCIUM 20 MG PO TABS: 20.0000 mg | ORAL_TABLET | Freq: Every day | ORAL | 3 refills | Status: DC

## 2023-06-27 MED ORDER — METFORMIN HCL ER 750 MG PO TB24: 750.0000 mg | ORAL_TABLET | Freq: Two times a day (BID) | ORAL | 3 refills | Status: DC

## 2023-06-27 MED ORDER — VITAMIN D (ERGOCALCIFEROL) 1.25 MG (50000 UNIT) PO CAPS: 50000.0000 [IU] | ORAL_CAPSULE | ORAL | 0 refills | Status: DC

## 2023-06-27 NOTE — Progress Notes (Signed)
Stable pre-diabetes; improved Vit D. Improved cholesterol; The 10-year ASCVD risk score (Arnett DK, et al., 2019) is: 3.1%  If desired; increase metformin to BID dosing or 1000 mg once daily. As well as increase Crestor to 20 mg to assist with reducing risk of heart attack and stroke as LDL remains in 90s.  Continue Vit D x additional 3 months.

## 2023-07-01 ENCOUNTER — Telehealth: Payer: Self-pay

## 2023-07-01 NOTE — Telephone Encounter (Signed)
Pt called back  - Shared provider's note. Pt is already taking the medication in the evening and still feels poorly.  Pt will try 1/2 dose to see if this is better.

## 2023-07-01 NOTE — Telephone Encounter (Signed)
Called patient to advised of lab results.   Patient wants provider to know that since she started the medication Naltrexone that she started to feel dizzy and hot sweats. At first she thought it was from the flu shot since she had received the flu shot the day before. Reports that she still been having the symptoms. Patient has decided to stop the medication today. Reports that is not from the Phentermine because she just started that medication yesterday

## 2023-07-01 NOTE — Telephone Encounter (Signed)
LMTCB-Ok for [facility] Nurse to give patient provider's meassage

## 2023-07-03 ENCOUNTER — Encounter: Payer: Self-pay | Admitting: Family Medicine

## 2023-07-04 ENCOUNTER — Telehealth: Payer: Self-pay | Admitting: Physician Assistant

## 2023-07-04 NOTE — Telephone Encounter (Signed)
Just for record: Spoke with patient , patient was advised to discontinue naltrexone and continue with phentermine and follow-up with [place] in a month

## 2023-07-07 DIAGNOSIS — M25561 Pain in right knee: Secondary | ICD-10-CM | POA: Diagnosis not present

## 2023-07-07 DIAGNOSIS — M13861 Other specified arthritis, right knee: Secondary | ICD-10-CM | POA: Diagnosis not present

## 2023-07-14 DIAGNOSIS — Z1211 Encounter for screening for malignant neoplasm of colon: Secondary | ICD-10-CM | POA: Diagnosis not present

## 2023-07-23 LAB — COLOGUARD: COLOGUARD: POSITIVE — AB

## 2023-07-28 ENCOUNTER — Other Ambulatory Visit: Payer: Self-pay | Admitting: Family Medicine

## 2023-07-28 ENCOUNTER — Telehealth: Payer: Medicaid Other | Admitting: Physician Assistant

## 2023-07-28 DIAGNOSIS — B9689 Other specified bacterial agents as the cause of diseases classified elsewhere: Secondary | ICD-10-CM | POA: Diagnosis not present

## 2023-07-28 DIAGNOSIS — R195 Other fecal abnormalities: Secondary | ICD-10-CM

## 2023-07-28 DIAGNOSIS — J019 Acute sinusitis, unspecified: Secondary | ICD-10-CM

## 2023-07-28 MED ORDER — DOXYCYCLINE HYCLATE 100 MG PO TABS: 100.0000 mg | ORAL_TABLET | Freq: Two times a day (BID) | ORAL | 0 refills | Status: DC

## 2023-07-28 NOTE — Progress Notes (Signed)
I have spent 5 minutes in review of e-visit questionnaire, review and updating patient chart, medical decision making and response to patient.   Mia Milan Cody Jacklynn Dehaas, PA-C    

## 2023-07-28 NOTE — Progress Notes (Signed)
Positive cologuard- referral placed to GI

## 2023-07-28 NOTE — Progress Notes (Signed)

## 2023-07-30 ENCOUNTER — Other Ambulatory Visit: Payer: Self-pay

## 2023-07-30 ENCOUNTER — Telehealth: Payer: Self-pay

## 2023-07-30 DIAGNOSIS — R195 Other fecal abnormalities: Secondary | ICD-10-CM

## 2023-07-30 DIAGNOSIS — Z1211 Encounter for screening for malignant neoplasm of colon: Secondary | ICD-10-CM

## 2023-07-30 MED ORDER — NA SULFATE-K SULFATE-MG SULF 17.5-3.13-1.6 GM/177ML PO SOLN: 1.0000 | Freq: Once | ORAL | 0 refills | Status: AC

## 2023-07-30 NOTE — Telephone Encounter (Signed)
Gastroenterology Pre-Procedure Review  Request Date: 09/10/23 Requesting Physician: Dr. Allegra Lai  PATIENT REVIEW QUESTIONS: The patient responded to the following health history questions as indicated:    1. Are you having any GI issues?  Positive cologuard, 1st colonoscopy 2. Do you have a personal history of Polyps? no 3. Do you have a family history of Colon Cancer or Polyps? no 4. Diabetes Mellitus? yes (has been advised to stop metformin 2 days prior and phentermine 1 day prior) 5. Joint replacements in the past 12 months?no 6. Major health problems in the past 3 months?no 7. Any artificial heart valves, MVP, or defibrillator?no    MEDICATIONS & ALLERGIES:    Patient reports the following regarding taking any anticoagulation/antiplatelet therapy:   Plavix, Coumadin, Eliquis, Xarelto, Lovenox, Pradaxa, Brilinta, or Effient? no Aspirin? no  Patient confirms/reports the following medications:  Current Outpatient Medications  Medication Sig Dispense Refill   clobetasol ointment (TEMOVATE) 0.05 % Apply 1 Application topically 2 (two) times daily. Max of 10 days in a row; use a pea size amount. 30 g 0   diclofenac (VOLTAREN) 75 MG EC tablet Take 1 tablet by mouth 2 (two) times daily with a meal.     doxycycline (VIBRA-TABS) 100 MG tablet Take 1 tablet (100 mg total) by mouth 2 (two) times daily. 20 tablet 0   losartan-hydrochlorothiazide (HYZAAR) 50-12.5 MG tablet Take 1 tablet by mouth daily. 90 tablet 3   metFORMIN (GLUCOPHAGE-XR) 750 MG 24 hr tablet Take 1 tablet (750 mg total) by mouth 2 (two) times daily after a meal. 180 tablet 3   Na Sulfate-K Sulfate-Mg Sulf 17.5-3.13-1.6 GM/177ML SOLN Take 1 kit by mouth once for 1 dose. 354 mL 0   naltrexone (DEPADE) 50 MG tablet Take 1 tablet (50 mg total) by mouth daily. 90 tablet 0   pantoprazole (PROTONIX) 40 MG tablet TAKE 1 TABLET(40 MG) BY MOUTH DAILY 90 tablet 0   phentermine 37.5 MG capsule Take 1 capsule (37.5 mg total) by mouth every  morning. 90 capsule 0   rosuvastatin (CRESTOR) 20 MG tablet Take 1 tablet (20 mg total) by mouth daily. 90 tablet 3   triamcinolone ointment (KENALOG) 0.5 % Apply 1 Application topically 2 (two) times daily. 30 g 0   Vitamin D, Ergocalciferol, (DRISDOL) 1.25 MG (50000 UNIT) CAPS capsule Take 1 capsule (50,000 Units total) by mouth every 7 (seven) days. 12 capsule 0   buPROPion (WELLBUTRIN XL) 300 MG 24 hr tablet Take 1 tablet (300 mg total) by mouth daily. 90 tablet 3   No current facility-administered medications for this visit.    Patient confirms/reports the following allergies:    No orders of the defined types were placed in this encounter.   AUTHORIZATION INFORMATION Primary Insurance: 1D#: Group #:  Secondary Insurance: 1D#: Group #:  SCHEDULE INFORMATION: Date: 09/10/23 Time: Location: [facility]

## 2023-08-22 ENCOUNTER — Other Ambulatory Visit: Payer: Self-pay | Admitting: Physician Assistant

## 2023-09-01 ENCOUNTER — Encounter: Payer: Self-pay | Admitting: Family Medicine

## 2023-09-01 ENCOUNTER — Telehealth: Payer: Self-pay | Admitting: Family Medicine

## 2023-09-01 ENCOUNTER — Ambulatory Visit (INDEPENDENT_AMBULATORY_CARE_PROVIDER_SITE_OTHER): Payer: Managed Care, Other (non HMO) | Admitting: Family Medicine

## 2023-09-01 VITALS — BP 128/72 | HR 97 | Resp 20 | Ht 64.0 in | Wt 297.1 lb

## 2023-09-01 DIAGNOSIS — M1711 Unilateral primary osteoarthritis, right knee: Secondary | ICD-10-CM | POA: Insufficient documentation

## 2023-09-01 DIAGNOSIS — E559 Vitamin D deficiency, unspecified: Secondary | ICD-10-CM | POA: Diagnosis not present

## 2023-09-01 DIAGNOSIS — I10 Essential (primary) hypertension: Secondary | ICD-10-CM | POA: Diagnosis not present

## 2023-09-01 DIAGNOSIS — R7303 Prediabetes: Secondary | ICD-10-CM

## 2023-09-01 MED ORDER — TIRZEPATIDE-WEIGHT MANAGEMENT 2.5 MG/0.5ML ~~LOC~~ SOLN: 2.5000 mg | SUBCUTANEOUS | 1 refills | Status: DC

## 2023-09-01 NOTE — Assessment & Plan Note (Addendum)
 Continue 750 xr  BID metformin to assist Continue to recommend balanced, lower carb meals. Smaller meal size, adding snacks. Choosing water as drink of choice and increasing purposeful exercise. Plan to recheck A1C at next appt in 8 weeks

## 2023-09-01 NOTE — Assessment & Plan Note (Signed)
 Chronic Continue to see ortho for injections and pain mgmt Physical therapy ordered to help with strengthening to allow for better weight loss progression Stressed weight loss will help with knee pain.

## 2023-09-01 NOTE — Telephone Encounter (Signed)
[facility] pharmacy is requesting prescription refill

## 2023-09-01 NOTE — Assessment & Plan Note (Signed)
 Continue weekly supplement Will plan to recheck at visit in 8 weeks

## 2023-09-01 NOTE — Assessment & Plan Note (Signed)
 Chronic, controlled Continue Hyzaar 50/12.5mg  daily

## 2023-09-01 NOTE — Progress Notes (Signed)
 Established Patient Office Visit  Subjective   Patient ID: Sabrina Fuentes, female    DOB: 02-Dec-1970  Age: 53 y.o. MRN: [id number]  Chief Complaint  Patient presents with   Obesity         Pt presents for weight loss management. Recently trialed both naltrexone  and phentermine . She felt awful on naltrexone  and her heart raced with phentermine . She stopped both. She is still interested in trying an injectable. Had great success with Ozempic  two years ago. She is using weight watchers, has a psychologist, occupational on app. States she also have access to nutritionist via app. Concerns for increased weight gain, states because of her R knee arthritis pain she can barely exercise. She see ortho for pain mgmt and knee injections. But pain is limiting her exercise ability. Continues to gain weight.          09/01/2023    1:26 PM 06/26/2023    1:10 PM 04/22/2023   11:18 AM  Depression screen PHQ 2/9  Decreased Interest 1 1 1   Down, Depressed, Hopeless 1 1 1   PHQ - 2 Score 2 2 2   Altered sleeping 1 1 1   Tired, decreased energy 3 0 2  Change in appetite 0 0 1  Feeling bad or failure about yourself  0 0 1  Trouble concentrating 1 0 2  Moving slowly or fidgety/restless 0 0 0  Suicidal thoughts 0 0 0  PHQ-9 Score 7 3 9   Difficult doing work/chores  Not difficult at all Not difficult at all       09/01/2023    1:26 PM 06/26/2023    1:12 PM 04/22/2023   11:18 AM  GAD 7 : Generalized Anxiety Score  Nervous, Anxious, on Edge 2 1 1   Control/stop worrying 2 1 3   Worry too much - different things 2 1 3   Trouble relaxing 2 1 2   Restless 0 0 1  Easily annoyed or irritable 2 2 3   Afraid - awful might happen 1 0 1  Total GAD 7 Score 11 6 14   Anxiety Difficulty  Somewhat difficult Somewhat difficult     Review of Systems  All other systems reviewed and are negative.   Negative unless indicated in HPI   Objective:     BP 128/72   Pulse 97   Resp 20   Ht 5' 4 (1.626 m)   Wt 297 lb 1.6 oz (134.8  kg)   SpO2 98%   BMI 51.00 kg/m    Physical Exam Constitutional:      General: She is not in acute distress.    Appearance: Normal appearance. She is obese. She is not ill-appearing, toxic-appearing or diaphoretic.  Eyes:     Extraocular Movements: Extraocular movements intact.     Pupils: Pupils are equal, round, and reactive to light.  Cardiovascular:     Rate and Rhythm: Normal rate and regular rhythm.     Pulses: Normal pulses.     Heart sounds: Murmur heard.  Pulmonary:     Effort: No respiratory distress.     Breath sounds: No stridor. No wheezing, rhonchi or rales.  Chest:     Chest wall: No tenderness.  Musculoskeletal:     Cervical back: No tenderness.  Lymphadenopathy:     Cervical: No cervical adenopathy.  Skin:    General: Skin is warm and dry.     Capillary Refill: Capillary refill takes less than 2 seconds.  Neurological:     Mental Status: She  is alert and oriented to person, place, and time. Mental status is at baseline.      No results found for any visits on 09/01/23.    The 10-year ASCVD risk score (Arnett DK, et al., 2019) is: 4.2%    Assessment & Plan:  Arthritis of right knee Assessment & Plan: Chronic Continue to see ortho for injections and pain mgmt Physical therapy ordered to help with strengthening to allow for better weight loss progression Stressed weight loss will help with knee pain.  Orders: -     Ambulatory referral to Physical Therapy  Morbid obesity (HCC) Assessment & Plan: Chronic, worsening, concerning Body mass index is increased from 49.26 kg/m to 51.00. Pt stopped naltrexone  and phentermine  Continue metformin  Continue weight watchers [facility]) Will order Zepbound  for pt for weight - discussed may not be covered by insurance - If no will trial topamax.  May need referral to weight mgmt/bariatric and nutriton - pt states she has access to nutrtionist through [employer]. Discussed importance of healthy weight management Stressed  diet and exercise - portion sizes Try water exercise given arthritis in knee   Orders: -     Tirzepatide -Weight Management; Inject 2.5 mg into the skin once a week.  Dispense: 2 mL; Refill: 1  Prediabetes Assessment & Plan: Continue 750 xr  BID metformin  to assist Continue to recommend balanced, lower carb meals. Smaller meal size, adding snacks. Choosing water as drink of choice and increasing purposeful exercise. Plan to recheck A1C at next appt in 8 weeks  Orders: -     Tirzepatide -Weight Management; Inject 2.5 mg into the skin once a week.  Dispense: 2 mL; Refill: 1  Vitamin D  deficiency - new diagnoses Assessment & Plan: Continue weekly supplement Will plan to recheck at visit in 8 weeks   Primary hypertension Assessment & Plan: Chronic, controlled Continue Hyzaar 50/12.5mg  daily     Return in about 8 weeks (around 10/27/2023) for weight and labs.   I, Curtis DELENA Boom, FNP, have reviewed all documentation for this visit. The documentation on 09/01/23 for the exam, diagnosis, procedures, and orders are all accurate and complete.   Curtis DELENA Boom, FNP

## 2023-09-01 NOTE — Assessment & Plan Note (Signed)
 Chronic, worsening, concerning Body mass index is increased from 49.26 kg/m to 51.00. Pt stopped naltrexone  and phentermine  Continue metformin  Continue weight watchers [facility]) Will order Zepbound  for pt for weight - discussed may not be covered by insurance - If no will trial topamax.  May need referral to weight mgmt/bariatric and nutriton - pt states she has access to nutrtionist through [employer]. Discussed importance of healthy weight management Stressed diet and exercise - portion sizes Try water exercise given arthritis in knee

## 2023-09-04 ENCOUNTER — Telehealth: Payer: Self-pay | Admitting: Family Medicine

## 2023-09-04 NOTE — Telephone Encounter (Signed)
 [facility] -PA needed for Zepbound 2.5mg /0.5ML INJ (4PF PENS)

## 2023-09-08 ENCOUNTER — Other Ambulatory Visit: Payer: Self-pay | Admitting: Family Medicine

## 2023-09-08 ENCOUNTER — Encounter: Payer: Self-pay | Admitting: Family Medicine

## 2023-09-10 ENCOUNTER — Ambulatory Visit
Admission: RE | Admit: 2023-09-10 | Discharge: 2023-09-10 | Disposition: A | Discharge status: Home / Self Care | Payer: Managed Care, Other (non HMO) | Attending: Gastroenterology | Admitting: Gastroenterology

## 2023-09-10 ENCOUNTER — Ambulatory Visit: Payer: Managed Care, Other (non HMO) | Admitting: Certified Registered"

## 2023-09-10 ENCOUNTER — Encounter
Admission: RE | Disposition: A | Discharge status: Home / Self Care | Payer: Self-pay | Source: Home / Self Care | Attending: Gastroenterology

## 2023-09-10 ENCOUNTER — Encounter: Payer: Self-pay | Admitting: Gastroenterology

## 2023-09-10 DIAGNOSIS — Z5982 Transportation insecurity: Secondary | ICD-10-CM | POA: Insufficient documentation

## 2023-09-10 DIAGNOSIS — Z7984 Long term (current) use of oral hypoglycemic drugs: Secondary | ICD-10-CM | POA: Diagnosis not present

## 2023-09-10 DIAGNOSIS — K562 Volvulus: Secondary | ICD-10-CM

## 2023-09-10 DIAGNOSIS — Z1211 Encounter for screening for malignant neoplasm of colon: Secondary | ICD-10-CM | POA: Insufficient documentation

## 2023-09-10 DIAGNOSIS — Z79899 Other long term (current) drug therapy: Secondary | ICD-10-CM | POA: Diagnosis not present

## 2023-09-10 DIAGNOSIS — R195 Other fecal abnormalities: Secondary | ICD-10-CM

## 2023-09-10 DIAGNOSIS — G4730 Sleep apnea, unspecified: Secondary | ICD-10-CM | POA: Insufficient documentation

## 2023-09-10 DIAGNOSIS — D124 Benign neoplasm of descending colon: Secondary | ICD-10-CM | POA: Diagnosis not present

## 2023-09-10 DIAGNOSIS — K635 Polyp of colon: Secondary | ICD-10-CM | POA: Diagnosis not present

## 2023-09-10 DIAGNOSIS — K621 Rectal polyp: Secondary | ICD-10-CM | POA: Diagnosis not present

## 2023-09-10 DIAGNOSIS — F17290 Nicotine dependence, other tobacco product, uncomplicated: Secondary | ICD-10-CM | POA: Diagnosis not present

## 2023-09-10 DIAGNOSIS — D125 Benign neoplasm of sigmoid colon: Secondary | ICD-10-CM

## 2023-09-10 DIAGNOSIS — K219 Gastro-esophageal reflux disease without esophagitis: Secondary | ICD-10-CM | POA: Insufficient documentation

## 2023-09-10 DIAGNOSIS — Z7985 Long-term (current) use of injectable non-insulin antidiabetic drugs: Secondary | ICD-10-CM | POA: Diagnosis not present

## 2023-09-10 DIAGNOSIS — Z6841 Body Mass Index (BMI) 40.0 and over, adult: Secondary | ICD-10-CM | POA: Diagnosis not present

## 2023-09-10 DIAGNOSIS — E119 Type 2 diabetes mellitus without complications: Secondary | ICD-10-CM | POA: Diagnosis not present

## 2023-09-10 DIAGNOSIS — E6689 Other obesity not elsewhere classified: Secondary | ICD-10-CM | POA: Diagnosis not present

## 2023-09-10 DIAGNOSIS — D123 Benign neoplasm of transverse colon: Secondary | ICD-10-CM

## 2023-09-10 DIAGNOSIS — Z5986 Financial insecurity: Secondary | ICD-10-CM | POA: Insufficient documentation

## 2023-09-10 DIAGNOSIS — D128 Benign neoplasm of rectum: Secondary | ICD-10-CM | POA: Diagnosis not present

## 2023-09-10 DIAGNOSIS — I10 Essential (primary) hypertension: Secondary | ICD-10-CM | POA: Insufficient documentation

## 2023-09-10 HISTORY — PX: POLYPECTOMY: SHX5525

## 2023-09-10 HISTORY — PX: COLONOSCOPY WITH PROPOFOL: SHX5780

## 2023-09-10 HISTORY — DX: Essential (primary) hypertension: I10

## 2023-09-10 HISTORY — DX: Type 2 diabetes mellitus without complications: E11.9

## 2023-09-10 LAB — GLUCOSE, CAPILLARY: Glucose-Capillary: 114 mg/dL — ABNORMAL HIGH (ref 70–99)

## 2023-09-10 SURGERY — COLONOSCOPY WITH PROPOFOL
Anesthesia: General

## 2023-09-10 MED ORDER — LIDOCAINE HCL (CARDIAC) PF 100 MG/5ML IV SOSY
PREFILLED_SYRINGE | INTRAVENOUS | Status: DC | PRN
  Administered 2023-09-10: 50 mg via INTRAVENOUS

## 2023-09-10 MED ORDER — GLYCOPYRROLATE 0.2 MG/ML IJ SOLN
INTRAMUSCULAR | Status: DC | PRN
  Administered 2023-09-10: .2 mg via INTRAVENOUS

## 2023-09-10 MED ORDER — SODIUM CHLORIDE 0.9 % IV SOLN: INTRAVENOUS | Status: DC

## 2023-09-10 MED ORDER — PROPOFOL 1000 MG/100ML IV EMUL
INTRAVENOUS | Status: AC
Start: 2023-09-10 — End: ?
  Filled 2023-09-10: qty 100

## 2023-09-10 MED ORDER — PROPOFOL 500 MG/50ML IV EMUL
INTRAVENOUS | Status: DC | PRN
  Administered 2023-09-10: 150 ug/kg/min via INTRAVENOUS

## 2023-09-10 MED ORDER — DEXMEDETOMIDINE HCL IN NACL 80 MCG/20ML IV SOLN
INTRAVENOUS | Status: DC | PRN
  Administered 2023-09-10: 8 ug via INTRAVENOUS

## 2023-09-10 MED ORDER — PROPOFOL 10 MG/ML IV BOLUS
INTRAVENOUS | Status: DC | PRN
  Administered 2023-09-10: 10 mg via INTRAVENOUS
  Administered 2023-09-10: 90 mg via INTRAVENOUS

## 2023-09-10 NOTE — Anesthesia Postprocedure Evaluation (Signed)
 Anesthesia Post Note  Patient: Sabrina Fuentes  Procedure(s) Performed: COLONOSCOPY WITH PROPOFOL  POLYPECTOMY  Patient location during evaluation: Endoscopy Anesthesia Type: General Level of consciousness: awake and alert Pain management: pain level controlled Vital Signs Assessment: post-procedure vital signs reviewed and stable Respiratory status: spontaneous breathing, nonlabored ventilation, respiratory function stable and patient connected to nasal cannula oxygen Cardiovascular status: blood pressure returned to baseline and stable Postop Assessment: no apparent nausea or vomiting Anesthetic complications: no   No notable events documented.   Last Vitals:  Vitals:   09/10/23 0935 09/10/23 0946  BP: 130/77 (!) 146/84  Pulse: 80 74  Resp: 14 15  Temp:    SpO2: 100% 98%    Last Pain:  Vitals:   09/10/23 0935  TempSrc:   PainSc: 0-No pain                 Nancey Awkward

## 2023-09-10 NOTE — Op Note (Signed)
 [facility] Gastroenterology Patient Name: Sabrina Fuentes Procedure Date: 09/10/2023 8:43 AM MRN: [id number] Account #: [id number] Date of Birth: 12/25/1970 Admit Type: Outpatient Age: 53 Room: [facility] ENDO ROOM 1 Gender: Female Note Status: Finalized Instrument Name: Colonoscope [id number] Procedure:             Colonoscopy Indications:           This is the patient's first colonoscopy, Positive                         Cologuard test Providers:             Selena Daily MD, MD Referring MD:          No Local Md, MD (Referring MD) Medicines:             General Anesthesia Complications:         No immediate complications. Estimated blood loss: None. Procedure:             Pre-Anesthesia Assessment:                        - Prior to the procedure, a History and Physical was                         performed, and patient medications and allergies were                         reviewed. The patient is competent. The risks and                         benefits of the procedure and the sedation options and                         risks were discussed with the patient. All questions                         were answered and informed consent was obtained.                         Patient identification and proposed procedure were                         verified by the physician, the nurse, the                         anesthesiologist, the anesthetist and the technician                         in the pre-procedure area in the procedure room in the                         endoscopy suite. Mental Status Examination: alert and                         oriented. Airway Examination: normal oropharyngeal                         airway and neck mobility. Respiratory Examination:  clear to auscultation. CV Examination: normal.                         Prophylactic Antibiotics: The patient does not require                         prophylactic antibiotics. Prior  Anticoagulants: The                         patient has taken no anticoagulant or antiplatelet                         agents. ASA Grade Assessment: III - A patient with                         severe systemic disease. After reviewing the risks and                         benefits, the patient was deemed in satisfactory                         condition to undergo the procedure. The anesthesia                         plan was to use general anesthesia. Immediately prior                         to administration of medications, the patient was                         re-assessed for adequacy to receive sedatives. The                         heart rate, respiratory rate, oxygen saturations,                         blood pressure, adequacy of pulmonary ventilation, and                         response to care were monitored throughout the                         procedure. The physical status of the patient was                         re-assessed after the procedure.                        After obtaining informed consent, the colonoscope was                         passed under direct vision. Throughout the procedure,                         the patient's blood pressure, pulse, and oxygen                         saturations were monitored continuously. The  Colonoscope was introduced through the anus and                         advanced to the the cecum, identified by appendiceal                         orifice and ileocecal valve. The colonoscopy was                         performed with moderate difficulty due to significant                         looping and the patient's body habitus. Successful                         completion of the procedure was aided by applying                         abdominal pressure. The patient tolerated the                         procedure well. The quality of the bowel preparation                         was evaluated using the BBPS  [facility] Bowel Preparation                         Scale) with scores of: Right Colon = 3, Transverse                         Colon = 3 and Left Colon = 3 (entire mucosa seen well                         with no residual staining, small fragments of stool or                         opaque liquid). The total BBPS score equals 9. The                         ileocecal valve, appendiceal orifice, and rectum were                         photographed. Findings:      The perianal and digital rectal examinations were normal. Pertinent       negatives include normal sphincter tone and no palpable rectal lesions.      A 20 mm polyp was found in the transverse colon. The polyp was flat.       Preparations were made for mucosal resection. Demarcation of the lesion       was performed with narrow band imaging to clearly identify the       boundaries of the lesion. Eleview was injected to raise the lesion.       Snare mucosal resection was performed. Resection and retrieval were       complete. Resected tissue including tissue margins will be examined by       histology. Estimated blood loss: none.      A 6 mm polyp  was found in the transverse colon. The polyp was sessile.       The polyp was removed with a hot snare. Resection and retrieval were       complete. Estimated blood loss: none.      A 15 mm polyp was found in the descending colon. The polyp was sessile.       The polyp was removed with a hot snare. Resection and retrieval were       complete. Estimated blood loss: none.      A 10 mm polyp was found in the sigmoid colon. The polyp was sessile. The       polyp was removed with a hot snare. Resection and retrieval were       complete. Estimated blood loss: none.      A 20 mm polyp was found in the proximal rectum. The polyp was       semi-pedunculated. The polyp was removed with a hot snare. Resection and       retrieval were complete. Estimated blood loss: none.      Two sessile polyps were  found in the rectum. The polyps were 5 to 6 mm       in size. These polyps were removed with a cold snare. Resection and       retrieval were complete. Estimated blood loss: none.      The retroflexed view of the distal rectum and anal verge was normal and       showed no anal or rectal abnormalities. Impression:            - One 20 mm polyp in the transverse colon, removed                         with mucosal resection. Resected and retrieved.                        - One 6 mm polyp in the transverse colon, removed with                         a hot snare. Resected and retrieved.                        - One 15 mm polyp in the descending colon, removed                         with a hot snare. Resected and retrieved.                        - One 10 mm polyp in the sigmoid colon, removed with a                         hot snare. Resected and retrieved.                        - One 20 mm polyp in the proximal rectum, removed with                         a hot snare. Resected and retrieved.                        - Two 5 to  6 mm polyps in the rectum, removed with a                         cold snare. Resected and retrieved.                        - The distal rectum and anal verge are normal on                         retroflexion view.                        - Mucosal resection was performed. Resection and                         retrieval were complete. Recommendation:        - Discharge patient to home (with escort).                        - Resume previous diet today.                        - Continue present medications.                        - Await pathology results.                        - Repeat colonoscopy in 3 years for surveillance of                         multiple polyps. Procedure Code(s):     --- Professional ---                        [phone], Colonoscopy, flexible; with endoscopic mucosal                         resection                        [id number], 59, Colonoscopy,  flexible; with removal of                         tumor(s), polyp(s), or other lesion(s) by snare                         technique Diagnosis Code(s):     --- Professional ---                        D12.3, Benign neoplasm of transverse colon (hepatic                         flexure or splenic flexure)                        D12.4, Benign neoplasm of descending colon                        D12.5, Benign neoplasm of sigmoid colon  R19.5, Other fecal abnormalities                        D12.8, Benign neoplasm of rectum CPT copyright 2022 American Medical Association. All rights reserved. The codes documented in this report are preliminary and upon coder review may  be revised to meet current compliance requirements. Dr. Evia Hof Selena Daily MD, MD 09/10/2023 9:24:34 AM This report has been signed electronically. Number of Addenda: 0 Note Initiated On: 09/10/2023 8:43 AM Scope Withdrawal Time: 0 hours 24 minutes 52 seconds  Total Procedure Duration: 0 hours 28 minutes 42 seconds  Estimated Blood Loss:  Estimated blood loss: none.      [facility]

## 2023-09-10 NOTE — Transfer of Care (Signed)
 Immediate Anesthesia Transfer of Care Note  Patient: Sabrina Fuentes  Procedure(s) Performed: COLONOSCOPY WITH PROPOFOL  POLYPECTOMY  Patient Location: PACU and Endoscopy Unit  Anesthesia Type:General  Level of Consciousness: awake  Airway & Oxygen Therapy: Patient Spontanous Breathing  Post-op Assessment: Report given to RN and Post -op Vital signs reviewed and stable  Post vital signs: Reviewed and stable  Last Vitals:  Vitals Value Taken Time  BP 121/71 09/10/23 0925  Temp    Pulse 80 09/10/23 0925  Resp 14 09/10/23 0925  SpO2 100 % 09/10/23 0925  Vitals shown include unfiled device data.  Last Pain:  Vitals:   09/10/23 0810  TempSrc: Temporal  PainSc: 0-No pain         Complications: No notable events documented.

## 2023-09-10 NOTE — Anesthesia Procedure Notes (Signed)
 Procedure Name: MAC Date/Time: 09/10/2023 8:48 AM  Performed by: Donnamae Gaba, CRNAPre-anesthesia Checklist: Patient identified, Emergency Drugs available, Suction available, Patient being monitored and Timeout performed Patient Re-evaluated:Patient Re-evaluated prior to induction Oxygen Delivery Method: Nasal cannula Induction Type: IV induction Placement Confirmation: positive ETCO2 and CO2 detector

## 2023-09-10 NOTE — H&P (Signed)
 Sabrina Oz, MD [address]  [address]  [place], [place] [address]  Main: [phone]  Fax: [phone] Pager: [phone]  Primary Care Physician:  Tasia Farr, FNP Primary Gastroenterologist:  Dr. Karma Fuentes  Pre-Procedure History & Physical: HPI:  Sabrina Fuentes is a 53 y.o. female is here for an colonoscopy.   Past Medical History:  Diagnosis Date   Abdominal hernia    Allergy    Back pain    Clotting disorder (HCC)    Depression    Diabetes mellitus without complication (HCC)    Elevated hemoglobin A1c    GERD (gastroesophageal reflux disease)    H/O blood clots    Heart murmur    Heartburn    Hypertension    Pre-diabetes    Sleep apnea     Past Surgical History:  Procedure Laterality Date   TUBAL LIGATION     tubal removal      Prior to Admission medications   Medication Sig Start Date End Date Taking? Authorizing Provider  metFORMIN  (GLUCOPHAGE -XR) 750 MG 24 hr tablet Take 1 tablet (750 mg total) by mouth 2 (two) times daily after a meal. 06/27/23  Yes Normie Becton, FNP  rosuvastatin  (CRESTOR ) 20 MG tablet Take 1 tablet (20 mg total) by mouth daily. 06/27/23  Yes Normie Becton, FNP  buPROPion  (WELLBUTRIN  SR) 150 MG 12 hr tablet Take 150 mg by mouth 2 (two) times daily.    [provider]  buPROPion  (WELLBUTRIN  XL) 300 MG 24 hr tablet Take 300 mg by mouth daily.    [provider]  clobetasol  ointment (TEMOVATE ) 0.05 % Apply 1 Application topically 2 (two) times daily. Max of 10 days in a row; use a pea size amount. 03/19/23   Normie Becton, FNP  diclofenac (VOLTAREN) 75 MG EC tablet Take 1 tablet by mouth 2 (two) times daily with a meal. 06/16/23   [provider]  losartan -hydrochlorothiazide  (HYZAAR) 50-12.5 MG tablet Take 1 tablet by mouth daily. 03/19/23   Iona Manis T, FNP  pantoprazole  (PROTONIX ) 40 MG tablet TAKE 1 TABLET(40 MG) BY MOUTH DAILY 06/16/23   Iona Manis T, FNP  tirzepatide (ZEPBOUND) 2.5  MG/0.5ML injection vial Inject 2.5 mg into the skin once a week. Patient not taking: Reported on 09/03/2023 09/01/23   Tasia Farr, FNP  triamcinolone  ointment (KENALOG ) 0.5 % Apply 1 Application topically 2 (two) times daily. 03/19/23   Normie Becton, FNP  Vitamin D , Ergocalciferol , (DRISDOL ) 1.25 MG (50000 UNIT) CAPS capsule Take 1 capsule (50,000 Units total) by mouth every 7 (seven) days. 06/27/23   Normie Becton, FNP    Allergies as of 07/30/2023 - Review Complete 07/30/2023  Allergen Reaction Noted   Aspirin  07/08/2017   Iodinated contrast media Itching 08/27/2015   Penicillins Hives 06/09/2015    Family History  Problem Relation Age of Onset   Diabetes Father    Arthritis Maternal Grandmother    Diabetes Paternal Grandmother    Diabetes Maternal Aunt    Diabetes Paternal Aunt    Diabetes Paternal Uncle     Social History   Socioeconomic History   Marital status: Single    Spouse name: Not on file   Number of children: Not on file   Years of education: Not on file   Highest education level: GED or equivalent  Occupational History   Not on file  Tobacco Use   Smoking status: Some Days    Types:  E-cigarettes   Smokeless tobacco: Never   Tobacco comments:    Reports 1 vape will last "months"  Vaping Use   Vaping status: Some Days  Substance and Sexual Activity   Alcohol use: No   Drug use: No   Sexual activity: Yes  Other Topics Concern   Not on file  Social History Narrative   Not on file   Social Drivers of Health   Financial Resource Strain: Medium Risk (11/27/2022)   Overall Financial Resource Strain (CARDIA)    Difficulty of Paying Living Expenses: Somewhat hard  Food Insecurity: No Food Insecurity (11/27/2022)   Hunger Vital Sign    Worried About Running Out of Food in the Last Year: Never true    Ran Out of Food in the Last Year: Never true  Transportation Needs: No Transportation Needs (11/27/2022)   PRAPARE - Scientist, research (physical sciences) (Medical): No    Lack of Transportation (Non-Medical): No  Recent Concern: Transportation Needs - Unmet Transportation Needs (10/16/2022)   PRAPARE - Transportation    Lack of Transportation (Medical): Yes    Lack of Transportation (Non-Medical): Yes  Physical Activity: Unknown (11/27/2022)   Exercise Vital Sign    Days of Exercise per Week: 0 days    Minutes of Exercise per Session: Not on file  Stress: Stress Concern Present (11/27/2022)   [employer] of Occupational Health - Occupational Stress Questionnaire    Feeling of Stress : To some extent  Social Connections: Moderately Isolated (11/27/2022)   Social Connection and Isolation Panel [NHANES]    Frequency of Communication with Friends and Family: More than three times a week    Frequency of Social Gatherings with Friends and Family: Twice a week    Attends Religious Services: More than 4 times per year    Active Member of [employer] or Organizations: No    Attends Banker Meetings: Not on file    Marital Status: Separated  Intimate Partner Violence: Not At Risk (10/16/2022)   Humiliation, Afraid, Rape, and Kick questionnaire    Fear of Current or Ex-Partner: No    Emotionally Abused: No    Physically Abused: No    Sexually Abused: No    Review of Systems: See HPI, otherwise negative ROS  Physical Exam: BP 127/74   Pulse 76   Temp (!) 96.9 F (36.1 C) (Temporal)   Resp 20   Ht 5\' 4"  (1.626 m)   Wt 133.4 kg   SpO2 99%   BMI 50.46 kg/m  General:   Alert,  pleasant and cooperative in NAD Head:  Normocephalic and atraumatic. Neck:  Supple; no masses or thyromegaly. Lungs:  Clear throughout to auscultation.    Heart:  Regular rate and rhythm. Abdomen:  Soft, nontender and nondistended. Normal bowel sounds, without guarding, and without rebound.   Neurologic:  Alert and  oriented x4;  grossly normal neurologically.  Impression/Plan: Sabrina Fuentes is here for an colonoscopy to be performed for  cologuard positive test  Risks, benefits, limitations, and alternatives regarding  colonoscopy have been reviewed with the patient.  Questions have been answered.  All parties agreeable.   Ellis Guys, MD  09/10/2023, 8:43 AM

## 2023-09-10 NOTE — Anesthesia Preprocedure Evaluation (Signed)
 Anesthesia Evaluation  Patient identified by MRN, date of birth, ID band Patient awake    Reviewed: Allergy & Precautions, NPO status , Patient's Chart, lab work & pertinent test results  History of Anesthesia Complications Negative for: history of anesthetic complications  Airway Mallampati: III  TM Distance: >3 FB Neck ROM: full    Dental no notable dental hx.    Pulmonary sleep apnea and Continuous Positive Airway Pressure Ventilation , Current Smoker   Pulmonary exam normal        Cardiovascular hypertension, On Medications negative cardio ROS Normal cardiovascular exam     Neuro/Psych  PSYCHIATRIC DISORDERS  Depression    negative neurological ROS     GI/Hepatic Neg liver ROS,GERD  Medicated,,  Endo/Other  diabetes  Class 4 obesity  Renal/GU negative Renal ROS  negative genitourinary   Musculoskeletal   Abdominal   Peds  Hematology negative hematology ROS (+)   Anesthesia Other Findings Past Medical History: No date: Abdominal hernia No date: Allergy No date: Back pain No date: Clotting disorder (HCC) No date: Depression No date: Diabetes mellitus without complication (HCC) No date: Elevated hemoglobin A1c No date: GERD (gastroesophageal reflux disease) No date: H/O blood clots No date: Heart murmur No date: Heartburn No date: Hypertension No date: Pre-diabetes No date: Sleep apnea  Past Surgical History: No date: TUBAL LIGATION No date: tubal removal  BMI    Body Mass Index: 50.46 kg/m      Reproductive/Obstetrics negative OB ROS                             Anesthesia Physical Anesthesia Plan  ASA: 3  Anesthesia Plan: General   Post-op Pain Management: Minimal or no pain anticipated   Induction: Intravenous  PONV Risk Score and Plan: 2 and Propofol  infusion and TIVA  Airway Management Planned: Natural Airway and Nasal Cannula  Additional Equipment:    Intra-op Plan:   Post-operative Plan:   Informed Consent: I have reviewed the patients History and Physical, chart, labs and discussed the procedure including the risks, benefits and alternatives for the proposed anesthesia with the patient or authorized representative who has indicated his/her understanding and acceptance.     Dental Advisory Given  Plan Discussed with: Anesthesiologist, CRNA and Surgeon  Anesthesia Plan Comments: (Patient consented for risks of anesthesia including but not limited to:  - adverse reactions to medications - risk of airway placement if required - damage to eyes, teeth, lips or other oral mucosa - nerve damage due to positioning  - sore throat or hoarseness - Damage to heart, brain, nerves, lungs, other parts of body or loss of life  Patient voiced understanding and assent.)       Anesthesia Quick Evaluation

## 2023-09-11 ENCOUNTER — Encounter: Payer: Self-pay | Admitting: Gastroenterology

## 2023-09-14 LAB — SURGICAL PATHOLOGY

## 2023-09-15 ENCOUNTER — Encounter: Payer: Self-pay | Admitting: Gastroenterology

## 2023-09-18 ENCOUNTER — Ambulatory Visit: Payer: Managed Care, Other (non HMO)

## 2023-09-18 ENCOUNTER — Encounter: Payer: Self-pay | Admitting: Family Medicine

## 2023-09-22 ENCOUNTER — Ambulatory Visit: Payer: Managed Care, Other (non HMO)

## 2023-09-22 ENCOUNTER — Telehealth: Payer: Self-pay | Admitting: Family Medicine

## 2023-09-22 NOTE — Telephone Encounter (Signed)
[facility] pharmacy faxed refill request for the following medications:    Vitamin D, Ergocalciferol, (DRISDOL) 1.25 MG (50000 UNIT) CAPS capsule   Please advise

## 2023-09-23 ENCOUNTER — Other Ambulatory Visit: Payer: Self-pay | Admitting: Family Medicine

## 2023-09-23 DIAGNOSIS — E559 Vitamin D deficiency, unspecified: Secondary | ICD-10-CM

## 2023-09-23 MED ORDER — VITAMIN D (ERGOCALCIFEROL) 1.25 MG (50000 UNIT) PO CAPS: 50000.0000 [IU] | ORAL_CAPSULE | ORAL | 0 refills | Status: DC

## 2023-09-24 ENCOUNTER — Ambulatory Visit: Payer: Managed Care, Other (non HMO)

## 2023-09-26 ENCOUNTER — Ambulatory Visit: Payer: Medicaid Other | Admitting: Physician Assistant

## 2023-09-29 ENCOUNTER — Ambulatory Visit: Payer: Managed Care, Other (non HMO)

## 2023-09-29 ENCOUNTER — Telehealth: Payer: Managed Care, Other (non HMO) | Admitting: Nurse Practitioner

## 2023-09-29 DIAGNOSIS — J029 Acute pharyngitis, unspecified: Secondary | ICD-10-CM

## 2023-09-29 NOTE — Progress Notes (Signed)
  Because you are having difficulty swallowing , I feel your condition warrants further evaluation and I recommend that you be seen in a face-to-face visit.   NOTE: There will be NO CHARGE for this E-Visit   If you are having a true medical emergency, please call 911.     For an urgent face to face visit, [facility] has multiple urgent care centers for your convenience.  Click the link below for the full list of locations and hours, walk-in wait times, appointment scheduling options and driving directions:  Urgent Care - [place], [place], [place], [place], [place], [place]  [place]     Your MyChart E-visit questionnaire answers were reviewed by a board certified advanced clinical practitioner to complete your personal care plan based on your specific symptoms.    Thank you for using e-Visits.

## 2023-10-06 ENCOUNTER — Telehealth: Payer: Self-pay | Admitting: Family Medicine

## 2023-10-06 NOTE — Telephone Encounter (Signed)
 Fax from [place] is needed for tirzepatide (ZEPBOUND) 2.5 MG/0.5ML injection vial   Phone# [phone] Fax# [phone]

## 2023-10-06 NOTE — Telephone Encounter (Signed)
 Patient has called again today (said its the 3rd time) to inquire about prior auth for Zepbound. She stated [facility] was faxing another request today.   She said she has been waiting on this a month and no one has done this.  Please let patient know status of her PA.

## 2023-10-07 ENCOUNTER — Encounter: Payer: Self-pay | Admitting: Family Medicine

## 2023-10-08 ENCOUNTER — Other Ambulatory Visit (HOSPITAL_COMMUNITY): Payer: Self-pay

## 2023-10-08 ENCOUNTER — Telehealth: Payer: Self-pay

## 2023-10-08 NOTE — Telephone Encounter (Signed)
Pharmacy Patient Advocate Encounter   Received notification from Pt Calls Messages that prior authorization for Zepbound 2.5MG /0.5ML pen-injectors is required/requested.   Insurance verification completed.   The patient is insured through [facility] MEDICAID .   Per test claim: PA required; PA submitted to above mentioned insurance via CoverMyMeds Key/confirmation #/EOC [facility] Status is pending

## 2023-10-09 ENCOUNTER — Other Ambulatory Visit: Payer: Self-pay

## 2023-10-09 DIAGNOSIS — K2100 Gastro-esophageal reflux disease with esophagitis, without bleeding: Secondary | ICD-10-CM

## 2023-10-09 NOTE — Telephone Encounter (Signed)
Pharmacy Patient Advocate Encounter  Received notification from [facility] MEDICAID that Prior Authorization for Zepbound 2.5MG /0.5ML pen-injectors has been DENIED.  See denial reason below. No denial letter attached in CMM. Will attach denial letter to Media tab once received.   PA #/Case ID/Reference #: [id number]

## 2023-10-10 MED ORDER — PANTOPRAZOLE SODIUM 40 MG PO TBEC: 40.0000 mg | DELAYED_RELEASE_TABLET | Freq: Every day | ORAL | 1 refills | Status: DC

## 2023-10-13 NOTE — Telephone Encounter (Signed)
 Pt is calling to follow up on prior auth.  Updated the patient regarding the denial per note.   Pt would like to see if [facility] can be called in instead since it is a preferred drug.

## 2023-10-14 ENCOUNTER — Encounter: Payer: Self-pay | Admitting: Family Medicine

## 2023-10-14 ENCOUNTER — Other Ambulatory Visit: Payer: Self-pay | Admitting: Family Medicine

## 2023-10-14 NOTE — Telephone Encounter (Signed)
 Will message patient on MyChart for more information on her request.

## 2023-10-15 ENCOUNTER — Other Ambulatory Visit: Payer: Self-pay | Admitting: Family Medicine

## 2023-10-15 MED ORDER — WEGOVY 0.25 MG/0.5ML ~~LOC~~ SOAJ: 0.2500 mg | SUBCUTANEOUS | 1 refills | Status: DC

## 2023-10-16 ENCOUNTER — Telehealth: Payer: Self-pay

## 2023-10-16 ENCOUNTER — Other Ambulatory Visit (HOSPITAL_COMMUNITY): Payer: Self-pay

## 2023-10-16 ENCOUNTER — Telehealth: Payer: Self-pay | Admitting: Family Medicine

## 2023-10-16 NOTE — Telephone Encounter (Signed)
 [facility] pharmacy is requesting prior authorization Key: [id number] [facility] 0.25mg /0.5ML auto injectors

## 2023-10-16 NOTE — Telephone Encounter (Signed)
 Pharmacy Patient Advocate Encounter   Received notification from CoverMyMeds that prior authorization for [facility] 0.25MG /0.5ML auto-injectors is required/requested.   Insurance verification completed.   The patient is insured through [facility] MEDICAID .   Per test claim: PA required; PA submitted to above mentioned insurance via CoverMyMeds Key/confirmation #/EOC [id number] Status is pending

## 2023-10-17 DIAGNOSIS — M1711 Unilateral primary osteoarthritis, right knee: Secondary | ICD-10-CM | POA: Diagnosis not present

## 2023-10-20 ENCOUNTER — Other Ambulatory Visit (HOSPITAL_COMMUNITY): Payer: Self-pay

## 2023-10-20 NOTE — Telephone Encounter (Signed)
 Pharmacy Patient Advocate Encounter  Received notification from [facility] MEDICAID that Prior Authorization for [facility] 0.25MG /0.5ML auto-injectors has been APPROVED from 10/16/23 to 04/14/24. Spoke to pharmacy to process.Copay is $4.    PA #/Case ID/Reference #: [id number]

## 2023-10-24 ENCOUNTER — Telehealth: Payer: Self-pay | Admitting: Family Medicine

## 2023-10-24 NOTE — Telephone Encounter (Signed)
 Copied from CRM [id number]. Topic: Clinical - Prescription Issue >> Oct 24, 2023  9:53 AM Louie Casa B wrote: Reason for CRM: patient is calling saying that [facility] 0.25mg /0.5ML auto injectors is getting denied because there is [facility] that [facility] that [facility] info was sent over a week ago patient has been waiting for almost 2 weeks for this please call patient back asap [phone]

## 2023-10-27 ENCOUNTER — Ambulatory Visit: Payer: Self-pay | Admitting: Family Medicine

## 2023-10-27 ENCOUNTER — Other Ambulatory Visit (HOSPITAL_COMMUNITY): Payer: Self-pay

## 2023-10-27 NOTE — Telephone Encounter (Signed)
 Spoke with [employer], it is going through [employer] for a $4 copay but they have to order it. Pharmacist says it should be there today.

## 2023-10-28 ENCOUNTER — Encounter: Payer: Self-pay | Admitting: Family Medicine

## 2023-10-28 DIAGNOSIS — M25561 Pain in right knee: Secondary | ICD-10-CM | POA: Diagnosis not present

## 2023-10-28 NOTE — Telephone Encounter (Signed)
 Thank you for the update. I will let Ms. Maffei know.

## 2023-11-11 DIAGNOSIS — M25561 Pain in right knee: Secondary | ICD-10-CM | POA: Diagnosis not present

## 2023-11-16 ENCOUNTER — Other Ambulatory Visit: Payer: Self-pay | Admitting: Family Medicine

## 2023-11-16 DIAGNOSIS — E559 Vitamin D deficiency, unspecified: Secondary | ICD-10-CM

## 2023-11-17 MED ORDER — VITAMIN D (ERGOCALCIFEROL) 1.25 MG (50000 UNIT) PO CAPS: 50000.0000 [IU] | ORAL_CAPSULE | ORAL | 0 refills | Status: DC

## 2023-11-18 DIAGNOSIS — M25561 Pain in right knee: Secondary | ICD-10-CM | POA: Diagnosis not present

## 2023-11-20 ENCOUNTER — Ambulatory Visit
Admission: EM | Admit: 2023-11-20 | Discharge: 2023-11-20 | Disposition: A | Discharge status: Home / Self Care | Attending: Emergency Medicine | Admitting: Emergency Medicine

## 2023-11-20 ENCOUNTER — Telehealth: Admitting: Physician Assistant

## 2023-11-20 DIAGNOSIS — J309 Allergic rhinitis, unspecified: Secondary | ICD-10-CM

## 2023-11-20 DIAGNOSIS — R21 Rash and other nonspecific skin eruption: Secondary | ICD-10-CM | POA: Diagnosis not present

## 2023-11-20 DIAGNOSIS — L03116 Cellulitis of left lower limb: Secondary | ICD-10-CM | POA: Diagnosis not present

## 2023-11-20 MED ORDER — LEVOCETIRIZINE DIHYDROCHLORIDE 5 MG PO TABS: 5.0000 mg | ORAL_TABLET | Freq: Every evening | ORAL | 0 refills | Status: DC

## 2023-11-20 MED ORDER — DOXYCYCLINE HYCLATE 100 MG PO CAPS: 100.0000 mg | ORAL_CAPSULE | Freq: Two times a day (BID) | ORAL | 0 refills | Status: AC

## 2023-11-20 NOTE — Discharge Instructions (Addendum)
 Follow up with your primary care provider tomorrow.  Go to the emergency department if you have worsening symptoms.    Take the doxycycline as directed.  Start the Xyzal this evening as directed.

## 2023-11-20 NOTE — Progress Notes (Signed)
 E visit for Allergic Rhinitis We are sorry that you are not feeling well.  Here is how we plan to help!  Based on what you have shared with me it looks like you have Allergic Rhinitis.  Rhinitis is when a reaction occurs that causes nasal congestion, runny nose, sneezing, and itching.  Most types of rhinitis are caused by an inflammation and are associated with symptoms in the eyes ears or throat. There are several types of rhinitis.  The most common are acute rhinitis, which is usually caused by a viral illness, allergic or seasonal rhinitis, and nonallergic or year-round rhinitis.  Nasal allergies occur certain times of the year.  Allergic rhinitis is caused when allergens in the air trigger the release of histamine in the body.  Histamine causes itching, swelling, and fluid to build up in the fragile linings of the nasal passages, sinuses and eyelids.  An itchy nose and clear discharge are common.  I would recommend switching the Claritin to the following medication: Xyzal 5 mg take 1 tablet daily  I also would recommend continuing your nasal spray and also using an over the counter saline nasal spray.    HOME CARE:  You can use an over-the-counter saline nasal spray as needed Avoid areas where there is heavy dust, mites, or molds Stay indoors on windy days during the pollen season Keep windows closed in home, at least in bedroom; use air conditioner. Use high-efficiency house air filter Keep windows closed in car, turn AC on re-circulate Avoid playing out with dog during pollen season  GET HELP RIGHT AWAY IF:  If your symptoms do not improve within 10 days You become short of breath You develop yellow or green discharge from your nose for over 3 days You have coughing fits  MAKE SURE YOU:  Understand these instructions Will watch your condition Will get help right away if you are not doing well or get worse  Thank you for choosing an e-visit. Your e-visit answers were reviewed  by a board certified advanced clinical practitioner to complete your personal care plan. Depending upon the condition, your plan could have included both over the counter or prescription medications. Please review your pharmacy choice. Be sure that the pharmacy you have chosen is open so that you can pick up your prescription now.  If there is a problem you may message your provider in MyChart to have the prescription routed to another pharmacy. Your safety is important to [place]. If you have drug allergies check your prescription carefully.  For the next 24 hours, you can use MyChart to ask questions about today's visit, request a non-urgent call back, or ask for a work or school excuse from your e-visit provider. You will get an email in the next two days asking about your experience. I hope that your e-visit has been valuable and will speed your recovery.   Approximately 5 minutes was spent documenting and reviewing patient's chart.

## 2023-11-20 NOTE — ED Triage Notes (Signed)
 Patient to Urgent Care with complaints of an insect bite present to her left leg/ below knee.   Symptoms started this morning. Concerned about a possible spider bite.   Denies any fevers.

## 2023-11-20 NOTE — ED Provider Notes (Signed)
 Sabrina Fuentes    CSN: [id number] Arrival date & time: 11/20/23  1938      History   Chief Complaint Chief Complaint  Patient presents with   Insect Bite    HPI Sabrina Fuentes is a 53 y.o. female.  Patient presents with a possible insect bite on her left lower leg since this morning.  The area has become red and inflamed.  No open wounds or drainage.  No fever.  No treatments at home.  Patient had an e-visit today; diagnosed with allergic rhinitis; treated with Xyzal.  The history is provided by the patient and medical records.    Past Medical History:  Diagnosis Date   Abdominal hernia    Allergy    Back pain    Clotting disorder (HCC)    Depression    Diabetes mellitus without complication (HCC)    Elevated hemoglobin A1c    GERD (gastroesophageal reflux disease)    H/O blood clots    Heart murmur    Heartburn    Hypertension    Pre-diabetes    Sleep apnea     Patient Active Problem List   Diagnosis Date Noted   Positive colorectal cancer screening using Cologuard test 09/10/2023   Adenomatous polyp of transverse colon 09/10/2023   Adenomatous polyp of descending colon 09/10/2023   Adenomatous polyp of sigmoid colon 09/10/2023   Adenomatous rectal polyp 09/10/2023   Arthritis of right knee 09/01/2023   Encounter for immunization 06/26/2023   Colon cancer screening 06/26/2023   Rash of both hands 03/26/2023   Adjustment reaction to chronic stress 03/26/2023   Primary hypertension 03/26/2023   Obesity hypoventilation syndrome (HCC) 03/26/2023   Gastroesophageal reflux disease with esophagitis without hemorrhage 03/19/2023   Vaping nicotine dependence, tobacco product 03/19/2023   Annual physical exam 01/28/2023   Vitamin D deficiency - new diagnoses 01/07/2023   Smoking greater than 30 pack years- recently changed to vaping. 01/07/2023   Mixed hyperlipidemia -new diagnosis 01/07/2023   Prediabetes 12/09/2022   Morbid obesity (HCC) 10/18/2022    Encounter for smoking cessation counseling 10/18/2022   Encounter to establish care 10/18/2022   Other insomnia 10/18/2022   History of blood clotting disorder 10/18/2022   Heart murmur 10/18/2022   Depression, recurrent (HCC) 10/18/2022   Leg cramps 10/18/2022    Past Surgical History:  Procedure Laterality Date   COLONOSCOPY WITH PROPOFOL N/A 09/10/2023   Procedure: COLONOSCOPY WITH PROPOFOL;  Surgeon: Toney Reil, MD;  Location: [facility] ENDOSCOPY;  Service: Gastroenterology;  Laterality: N/A;   POLYPECTOMY  09/10/2023   Procedure: POLYPECTOMY;  Surgeon: Toney Reil, MD;  Location: [facility] ENDOSCOPY;  Service: Gastroenterology;;   TUBAL LIGATION     tubal removal      OB History   No obstetric history on file.      Home Medications    Prior to Admission medications   Medication Sig Start Date End Date Taking? Authorizing Provider  doxycycline (VIBRAMYCIN) 100 MG capsule Take 1 capsule (100 mg total) by mouth 2 (two) times daily for 7 days. 11/20/23 11/27/23 Yes Mickie Bail, NP  buPROPion (WELLBUTRIN SR) 150 MG 12 hr tablet Take 150 mg by mouth 2 (two) times daily.    [provider]  buPROPion (WELLBUTRIN XL) 300 MG 24 hr tablet Take 300 mg by mouth daily.    [provider]  clobetasol ointment (TEMOVATE) 0.05 % Apply 1 Application topically 2 (two) times daily. Max of 10 days in a  row; use a pea size amount. 03/19/23   Jacky Kindle, FNP  diclofenac (VOLTAREN) 75 MG EC tablet Take 1 tablet by mouth 2 (two) times daily with a meal. 06/16/23   [provider]  levocetirizine (XYZAL) 5 MG tablet Take 1 tablet (5 mg total) by mouth every evening. 11/20/23 12/20/23  Couture, Cortni S, PA-C  losartan-hydrochlorothiazide (HYZAAR) 50-12.5 MG tablet Take 1 tablet by mouth daily. 03/19/23   Jacky Kindle, FNP  metFORMIN (GLUCOPHAGE-XR) 750 MG 24 hr tablet Take 1 tablet (750 mg total) by mouth 2 (two) times daily after a meal. 06/27/23   Jacky Kindle, FNP   pantoprazole (PROTONIX) 40 MG tablet Take 1 tablet (40 mg total) by mouth daily. TAKE 1 TABLET(40 MG) BY MOUTH DAILY 10/10/23   Charlcie Cradle A, FNP  rosuvastatin (CRESTOR) 20 MG tablet Take 1 tablet (20 mg total) by mouth daily. 06/27/23   Jacky Kindle, FNP  Semaglutide-Weight Management (WEGOVY) 0.25 MG/0.5ML SOAJ Inject 0.25 mg into the skin once a week. 10/15/23   Sallee Provencal, FNP  triamcinolone ointment (KENALOG) 0.5 % Apply 1 Application topically 2 (two) times daily. 03/19/23   Jacky Kindle, FNP  Vitamin D, Ergocalciferol, (DRISDOL) 1.25 MG (50000 UNIT) CAPS capsule Take 1 capsule (50,000 Units total) by mouth every 7 (seven) days. 11/17/23   Debera Lat, PA-C    Family History Family History  Problem Relation Age of Onset   Diabetes Father    Arthritis Maternal Grandmother    Diabetes Paternal Grandmother    Diabetes Maternal Aunt    Diabetes Paternal Aunt    Diabetes Paternal Uncle     Social History Social History   Tobacco Use   Smoking status: Some Days    Types: E-cigarettes   Smokeless tobacco: Never   Tobacco comments:    Reports 1 vape will last "months"  Vaping Use   Vaping status: Some Days  Substance Use Topics   Alcohol use: No   Drug use: No     Allergies   Aspirin, Iodinated contrast media, and Penicillins   Review of Systems Review of Systems  Constitutional:  Negative for chills and fever.  Musculoskeletal:  Negative for arthralgias, gait problem and joint swelling.  Skin:  Positive for color change and rash.  Neurological:  Negative for weakness and numbness.     Physical Exam Triage Vital Signs ED Triage Vitals  Encounter Vitals Group     BP      Systolic BP Percentile      Diastolic BP Percentile      Pulse      Resp      Temp      Temp src      SpO2      Weight      Height      Head Circumference      Peak Flow      Pain Score      Pain Loc      Pain Education      Exclude from Growth Chart    No data  found.  Updated Vital Signs BP 139/88   Pulse 87   Temp 97.8 F (36.6 C)   Resp 18   LMP 11/13/2023   SpO2 96%   Visual Acuity Right Eye Distance:   Left Eye Distance:   Bilateral Distance:    Right Eye Near:   Left Eye Near:    Bilateral Near:     Physical  Exam Constitutional:      General: She is not in acute distress. HENT:     Mouth/Throat:     Mouth: Mucous membranes are moist.  Cardiovascular:     Rate and Rhythm: Normal rate and regular rhythm.  Pulmonary:     Effort: Pulmonary effort is normal. No respiratory distress.  Skin:    General: Skin is warm and dry.     Findings: Erythema and rash present.     Comments: Erythematous rash on left lower leg.  See pictures for details.  Neurological:     Mental Status: She is alert.         UC Treatments / Results  Labs (all labs ordered are listed, but only abnormal results are displayed) Labs Reviewed - No data to display  EKG   Radiology No results found.  Procedures Procedures (including critical care time)  Medications Ordered in UC Medications - No data to display  Initial Impression / Assessment and Plan / UC Course  I have reviewed the triage vital signs and the nursing notes.  Pertinent labs & imaging results that were available during my care of the patient were reviewed by me and considered in my medical decision making (see chart for details).   Cellulitis and rash on left lower leg.  Afebrile and vital signs are stable.  Treating with doxycycline.  Education provided on cellulitis.  Instructed patient to follow-up with her PCP tomorrow.  ED precautions given.  Patient agrees to plan of care.    Final Clinical Impressions(s) / UC Diagnoses   Final diagnoses:  Cellulitis of left lower leg  Rash     Discharge Instructions      Follow up with your primary care provider tomorrow.  Go to the emergency department if you have worsening symptoms.    Take the doxycycline as directed.   Start the Xyzal this evening as directed.       ED Prescriptions     Medication Sig Dispense Auth. Provider   doxycycline (VIBRAMYCIN) 100 MG capsule Take 1 capsule (100 mg total) by mouth 2 (two) times daily for 7 days. 14 capsule Mickie Bail, NP      PDMP not reviewed this encounter.   Mickie Bail, NP 11/20/23 [phone]

## 2023-12-03 ENCOUNTER — Ambulatory Visit
Admission: EM | Admit: 2023-12-03 | Discharge: 2023-12-03 | Disposition: A | Discharge status: Home / Self Care | Attending: Emergency Medicine | Admitting: Emergency Medicine

## 2023-12-03 DIAGNOSIS — S0501XA Injury of conjunctiva and corneal abrasion without foreign body, right eye, initial encounter: Secondary | ICD-10-CM

## 2023-12-03 DIAGNOSIS — S058X1A Other injuries of right eye and orbit, initial encounter: Secondary | ICD-10-CM

## 2023-12-03 MED ORDER — POLYMYXIN B-TRIMETHOPRIM 10000-0.1 UNIT/ML-% OP SOLN: 1.0000 [drp] | Freq: Four times a day (QID) | OPHTHALMIC | 0 refills | Status: AC

## 2023-12-03 NOTE — Discharge Instructions (Signed)
 Use the antibiotic eyedrops as prescribed.    Follow-up with your eye care provider.    Go to the emergency department if you have acute eye pain, changes in your vision, or other concerning symptoms.

## 2023-12-03 NOTE — ED Provider Notes (Signed)
 Renaldo Fiddler    CSN: [id number] Arrival date & time: 12/03/23  1232      History   Chief Complaint Chief Complaint  Patient presents with   Eye Problem    HPI Sabrina Fuentes is a 53 y.o. female.  Patient presents with right eye pain and tearing after she accidentally hit her eye with the paper while at work.  She states it feels like a paper cut on her eye.  No fever, purulent drainage.  No OTC treatment.  Patient wears glasses but does not currently have them on.  The history is provided by the patient and medical records.    Past Medical History:  Diagnosis Date   Abdominal hernia    Allergy    Back pain    Clotting disorder (HCC)    Depression    Diabetes mellitus without complication (HCC)    Elevated hemoglobin A1c    GERD (gastroesophageal reflux disease)    H/O blood clots    Heart murmur    Heartburn    Hypertension    Pre-diabetes    Sleep apnea     Patient Active Problem List   Diagnosis Date Noted   Positive colorectal cancer screening using Cologuard test 09/10/2023   Adenomatous polyp of transverse colon 09/10/2023   Adenomatous polyp of descending colon 09/10/2023   Adenomatous polyp of sigmoid colon 09/10/2023   Adenomatous rectal polyp 09/10/2023   Arthritis of right knee 09/01/2023   Encounter for immunization 06/26/2023   Colon cancer screening 06/26/2023   Rash of both hands 03/26/2023   Adjustment reaction to chronic stress 03/26/2023   Primary hypertension 03/26/2023   Obesity hypoventilation syndrome (HCC) 03/26/2023   Gastroesophageal reflux disease with esophagitis without hemorrhage 03/19/2023   Vaping nicotine dependence, tobacco product 03/19/2023   Annual physical exam 01/28/2023   Vitamin D deficiency - new diagnoses 01/07/2023   Smoking greater than 30 pack years- recently changed to vaping. 01/07/2023   Mixed hyperlipidemia -new diagnosis 01/07/2023   Prediabetes 12/09/2022   Morbid obesity (HCC) 10/18/2022    Encounter for smoking cessation counseling 10/18/2022   Encounter to establish care 10/18/2022   Other insomnia 10/18/2022   History of blood clotting disorder 10/18/2022   Heart murmur 10/18/2022   Depression, recurrent (HCC) 10/18/2022   Leg cramps 10/18/2022    Past Surgical History:  Procedure Laterality Date   COLONOSCOPY WITH PROPOFOL N/A 09/10/2023   Procedure: COLONOSCOPY WITH PROPOFOL;  Surgeon: Toney Reil, MD;  Location[facility] ENDOSCOPY;  Service: Gastroenterology;  Laterality: N/A;   POLYPECTOMY  09/10/2023   Procedure: POLYPECTOMY;  Surgeon: Toney Reil, MD;  Location: [facility] ENDOSCOPY;  Service: Gastroenterology;;   TUBAL LIGATION     tubal removal      OB History   No obstetric history on file.      Home Medications    Prior to Admission medications   Medication Sig Start Date End Date Taking? Authorizing Provider  trimethoprim-polymyxin b (POLYTRIM) ophthalmic solution Place 1 drop into the right eye 4 (four) times daily for 7 days. 12/03/23 12/10/23 Yes Mickie Bail, NP  buPROPion (WELLBUTRIN SR) 150 MG 12 hr tablet Take 150 mg by mouth 2 (two) times daily.    [provider]  buPROPion (WELLBUTRIN XL) 300 MG 24 hr tablet Take 300 mg by mouth daily.    [provider]  clobetasol ointment (TEMOVATE) 0.05 % Apply 1 Application topically 2 (two) times daily. Max of 10 days in  a row; use a pea size amount. 03/19/23   Jacky Kindle, FNP  diclofenac (VOLTAREN) 75 MG EC tablet Take 1 tablet by mouth 2 (two) times daily with a meal. 06/16/23   [provider]  levocetirizine (XYZAL) 5 MG tablet Take 1 tablet (5 mg total) by mouth every evening. 11/20/23 12/20/23  Couture, Cortni S, PA-C  losartan-hydrochlorothiazide (HYZAAR) 50-12.5 MG tablet Take 1 tablet by mouth daily. 03/19/23   Jacky Kindle, FNP  metFORMIN (GLUCOPHAGE-XR) 750 MG 24 hr tablet Take 1 tablet (750 mg total) by mouth 2 (two) times daily after a meal. 06/27/23   Jacky Kindle, FNP  pantoprazole (PROTONIX) 40 MG tablet Take 1 tablet (40 mg total) by mouth daily. TAKE 1 TABLET(40 MG) BY MOUTH DAILY 10/10/23   Charlcie Cradle A, FNP  rosuvastatin (CRESTOR) 20 MG tablet Take 1 tablet (20 mg total) by mouth daily. 06/27/23   Jacky Kindle, FNP  Semaglutide-Weight Management (WEGOVY) 0.25 MG/0.5ML SOAJ Inject 0.25 mg into the skin once a week. 10/15/23   Sallee Provencal, FNP  triamcinolone ointment (KENALOG) 0.5 % Apply 1 Application topically 2 (two) times daily. 03/19/23   Jacky Kindle, FNP  Vitamin D, Ergocalciferol, (DRISDOL) 1.25 MG (50000 UNIT) CAPS capsule Take 1 capsule (50,000 Units total) by mouth every 7 (seven) days. 11/17/23   Debera Lat, PA-C    Family History Family History  Problem Relation Age of Onset   Diabetes Father    Arthritis Maternal Grandmother    Diabetes Paternal Grandmother    Diabetes Maternal Aunt    Diabetes Paternal Aunt    Diabetes Paternal Uncle     Social History Social History   Tobacco Use   Smoking status: Some Days    Types: E-cigarettes   Smokeless tobacco: Never   Tobacco comments:    Reports 1 vape will last "months"  Vaping Use   Vaping status: Some Days  Substance Use Topics   Alcohol use: No   Drug use: No     Allergies   Aspirin, Iodinated contrast media, and Penicillins   Review of Systems Review of Systems  Constitutional:  Negative for chills and fever.  Eyes:  Positive for photophobia, pain, discharge and redness. Negative for visual disturbance.     Physical Exam Triage Vital Signs ED Triage Vitals [12/03/23 1238]  Encounter Vitals Group     BP 137/86     Systolic BP Percentile      Diastolic BP Percentile      Pulse Rate 80     Resp 18     Temp 97.7 F (36.5 C)     Temp src      SpO2 97 %     Weight      Height      Head Circumference      Peak Flow      Pain Score      Pain Loc      Pain Education      Exclude from Growth Chart    No data found.  Updated  Vital Signs BP 137/86   Pulse 80   Temp 97.7 F (36.5 C)   Resp 18   LMP 11/13/2023   SpO2 97%   Visual Acuity Right Eye Distance: UTA Left Eye Distance: 20/100 Bilateral Distance: 20/100  Right Eye Near:   Left Eye Near:    Bilateral Near:     Physical Exam Constitutional:      General: She  is not in acute distress. HENT:     Mouth/Throat:     Mouth: Mucous membranes are moist.  Eyes:     General: Lids are normal. Vision grossly intact.     Conjunctiva/sclera:     Right eye: Right conjunctiva is injected.     Pupils:     Right eye: Fluorescein uptake present.   Cardiovascular:     Rate and Rhythm: Normal rate and regular rhythm.  Pulmonary:     Effort: Pulmonary effort is normal. No respiratory distress.  Neurological:     Mental Status: She is alert.      UC Treatments / Results  Labs (all labs ordered are listed, but only abnormal results are displayed) Labs Reviewed - No data to display  EKG   Radiology No results found.  Procedures Procedures (including critical care time)  Medications Ordered in UC Medications - No data to display  Initial Impression / Assessment and Plan / UC Course  I have reviewed the triage vital signs and the nursing notes.  Pertinent labs & imaging results that were available during my care of the patient were reviewed by me and considered in my medical decision making (see chart for details).    Corneal abrasion, superficial injury of right cornea.  Treating with Polytrim eyedrops.  Instructed patient to follow-up with an ophthalmologist.  ED precautions discussed.  Contact information for [facility] provided.  Patient has her yearly eye exams at [facility].  She called there and was told that she needed to see an ophthalmologist.  Patient agrees to plan of care.  Final Clinical Impressions(s) / UC Diagnoses   Final diagnoses:  Corneal abrasion, right, initial encounter  Superficial injury of right cornea,  initial encounter     Discharge Instructions      Use the antibiotic eyedrops as prescribed.    Follow-up with your eye care provider.    Go to the emergency department if you have acute eye pain, changes in your vision, or other concerning symptoms.        ED Prescriptions     Medication Sig Dispense Auth. Provider   trimethoprim-polymyxin b (POLYTRIM) ophthalmic solution Place 1 drop into the right eye 4 (four) times daily for 7 days. 10 mL Mickie Bail, NP      PDMP not reviewed this encounter.   Mickie Bail, NP 12/03/23 1258

## 2023-12-03 NOTE — ED Triage Notes (Signed)
 Patient to Urgent Care with complaints of right sided eye pain following a possible injury approx 1.5 hours ago.  Reports that she was sorting through papers and caught her eye. Now having constant watering/ light sensitivity/ irritation.

## 2023-12-04 ENCOUNTER — Other Ambulatory Visit: Payer: Self-pay | Admitting: Physician Assistant

## 2023-12-04 DIAGNOSIS — E559 Vitamin D deficiency, unspecified: Secondary | ICD-10-CM

## 2023-12-05 MED ORDER — VITAMIN D (ERGOCALCIFEROL) 1.25 MG (50000 UNIT) PO CAPS: 50000.0000 [IU] | ORAL_CAPSULE | ORAL | 0 refills | Status: DC

## 2023-12-05 NOTE — Telephone Encounter (Signed)
Please, let pt know that  

## 2023-12-12 ENCOUNTER — Ambulatory Visit (INDEPENDENT_AMBULATORY_CARE_PROVIDER_SITE_OTHER): Admitting: Family Medicine

## 2023-12-12 ENCOUNTER — Encounter: Payer: Self-pay | Admitting: Family Medicine

## 2023-12-12 VITALS — BP 150/82 | HR 76 | Ht 64.0 in | Wt 281.0 lb

## 2023-12-12 DIAGNOSIS — I10 Essential (primary) hypertension: Secondary | ICD-10-CM

## 2023-12-12 DIAGNOSIS — E559 Vitamin D deficiency, unspecified: Secondary | ICD-10-CM

## 2023-12-12 MED ORDER — SEMAGLUTIDE-WEIGHT MANAGEMENT 0.5 MG/0.5ML ~~LOC~~ SOAJ: 0.5000 mg | SUBCUTANEOUS | 2 refills | Status: DC

## 2023-12-12 MED ORDER — VITAMIN D (ERGOCALCIFEROL) 1.25 MG (50000 UNIT) PO CAPS: 50000.0000 [IU] | ORAL_CAPSULE | ORAL | 0 refills | Status: DC

## 2023-12-12 MED ORDER — HYDROCHLOROTHIAZIDE 25 MG PO TABS
25.0000 mg | ORAL_TABLET | Freq: Every day | ORAL | 0 refills | Status: DC
Start: 2023-12-12 — End: 2024-01-08

## 2023-12-12 NOTE — Progress Notes (Signed)
 Established patient visit   Patient: Sabrina Fuentes   DOB: 11-27-1970   53 y.o. Female  MRN: [id number] Visit Date: 12/12/2023 PCP: Tasia Farr, FNP  Today's healthcare provider: Aden Agreste, MD   Chief Complaint  Patient presents with   Obesity   Medication Refill    Vitamin D   Last checked 06/26/23   Medication Reaction    Losartan  causes patient to be dizzy and nausea   Subjective    Medication Refill   HPI     Medication Refill    Additional comments: Vitamin D   Last checked 06/26/23        Medication Reaction    Additional comments: Losartan  causes patient to be dizzy and nausea      Last edited by Pasty Bongo, CMA on 12/12/2023  8:42 AM.       Discussed the use of AI scribe software for clinical note transcription with the patient, who gave verbal consent to proceed.  History of Present Illness   The patient, with a history of hypertension and obesity, presents for a follow-up after two months on Wegovy . She reports a weight loss of approximately six pounds since February, although she attributes this not only to the medication but also to lifestyle changes. She has not experienced any side effects from the medication and is open to increasing the dosage from 0.25 to 0.5 once a week.  The patient also reports issues with Losartan , suspecting it to be the cause of her discomfort. She has tried switching the medication to nighttime, along with Metformin  and an allergy medication, but the side effects seem to be worsening. She has attempted to isolate the cause by withholding Losartan  while continuing Metformin , and she believes Losartan  is the culprit. She also expresses a desire to reduce her overall medication load.  Lastly, the patient requests a refill for her Vitamin D  medication, which she has been taking consistently.         Medications: Outpatient Medications Prior to Visit  Medication Sig   buPROPion  (WELLBUTRIN  SR) 150 MG  12 hr tablet Take 150 mg by mouth 2 (two) times daily.   buPROPion  (WELLBUTRIN  XL) 300 MG 24 hr tablet Take 300 mg by mouth daily.   diclofenac (VOLTAREN) 75 MG EC tablet Take 1 tablet by mouth 2 (two) times daily with a meal.   levocetirizine (XYZAL ) 5 MG tablet Take 1 tablet (5 mg total) by mouth every evening.   metFORMIN  (GLUCOPHAGE -XR) 750 MG 24 hr tablet Take 1 tablet (750 mg total) by mouth 2 (two) times daily after a meal.   pantoprazole  (PROTONIX ) 40 MG tablet Take 1 tablet (40 mg total) by mouth daily. TAKE 1 TABLET(40 MG) BY MOUTH DAILY   rosuvastatin  (CRESTOR ) 20 MG tablet Take 1 tablet (20 mg total) by mouth daily.   [DISCONTINUED] losartan -hydrochlorothiazide  (HYZAAR) 50-12.5 MG tablet Take 1 tablet by mouth daily.   [DISCONTINUED] Semaglutide -Weight Management (WEGOVY ) 0.25 MG/0.5ML SOAJ Inject 0.25 mg into the skin once a week.   [DISCONTINUED] Vitamin D , Ergocalciferol , (DRISDOL ) 1.25 MG (50000 UNIT) CAPS capsule Take 1 capsule (50,000 Units total) by mouth every 7 (seven) days.   [DISCONTINUED] clobetasol  ointment (TEMOVATE ) 0.05 % Apply 1 Application topically 2 (two) times daily. Max of 10 days in a row; use a pea size amount.   [DISCONTINUED] triamcinolone  ointment (KENALOG ) 0.5 % Apply 1 Application topically 2 (two) times daily.   No facility-administered medications prior to visit.    Review  of Systems     Objective    BP (!) 150/82 (BP Location: Left Arm, Patient Position: Sitting, Cuff Size: Large)   Pulse 76   Ht 5\' 4"  (1.626 m)   Wt 281 lb (127.5 kg)   LMP 11/13/2023   BMI 48.23 kg/m    Physical Exam Vitals reviewed.  Constitutional:      General: She is not in acute distress.    Appearance: Normal appearance. She is well-developed. She is not diaphoretic.  HENT:     Head: Normocephalic and atraumatic.  Eyes:     General: No scleral icterus.    Conjunctiva/sclera: Conjunctivae normal.  Neck:     Thyroid: No thyromegaly.  Cardiovascular:     Rate  and Rhythm: Normal rate and regular rhythm.     Heart sounds: Murmur heard.  Pulmonary:     Effort: Pulmonary effort is normal. No respiratory distress.     Breath sounds: Normal breath sounds. No wheezing, rhonchi or rales.  Musculoskeletal:     Cervical back: Neck supple.     Right lower leg: No edema.     Left lower leg: No edema.  Lymphadenopathy:     Cervical: No cervical adenopathy.  Skin:    General: Skin is warm and dry.     Findings: No rash.  Neurological:     Mental Status: She is alert and oriented to person, place, and time. Mental status is at baseline.  Psychiatric:        Mood and Affect: Mood normal.        Behavior: Behavior normal.      No results found for any visits on 12/12/23.  Assessment & Plan     Problem List Items Addressed This Visit       Cardiovascular and Mediastinum   Primary hypertension - Primary   Relevant Medications   hydrochlorothiazide  (HYDRODIURIL ) 25 MG tablet     Other   Morbid obesity (HCC)   Relevant Medications   Semaglutide -Weight Management 0.5 MG/0.5ML SOAJ (Start on 01/10/2024)   Vitamin D  deficiency - new diagnoses   Relevant Medications   Vitamin D , Ergocalciferol , (DRISDOL ) 1.25 MG (50000 UNIT) CAPS capsule        Hypertension She experiences side effects from losartan , which worsen with use. She reports improvement when skipping losartan , suggesting it as the cause. Currently on a losartan /hydrochlorothiazide  combination, the plan is to discontinue this and trial hydrochlorothiazide  alone at a higher dose to simplify the regimen and assess side effects. Additional medication may be needed for blood pressure control, but starting with a single medication is preferred. Hydrochlorothiazide  is recommended in the morning to avoid nocturia. - Discontinue losartan /hydrochlorothiazide  combination. - Initiate hydrochlorothiazide  25 mg daily in the morning. - Schedule follow-up in one month to assess blood pressure control and  side effects.  Obesity She has been on Wegovy  0.25 mg weekly for two months, resulting in a six-pound weight loss since February, attributed to medication and lifestyle changes. No side effects reported. The dose will be increased to 0.5 mg weekly to enhance weight loss, aiming for effective results without significant side effects. Emphasized the importance of gradual dose escalation to avoid side effects and maintain effectiveness, with adjustments if weight loss plateaus. - Increase Wegovy  to 0.5 mg once a week. - Continue lifestyle modifications for weight loss. - Schedule follow-up in 2-3 months to assess weight loss and side effects.  Vitamin D  deficiency She requires a refill of her vitamin D  prescription. Her levels  were last checked six months ago, and she has been on the same dose since then. - Refill vitamin D  prescription.          Return in about 4 weeks (around 01/09/2024) for BP f/u with Dr B.       Aden Agreste, MD  [facility] [phone] (phone) [phone] (fax)  [facility]

## 2024-01-01 ENCOUNTER — Other Ambulatory Visit: Payer: Self-pay | Admitting: Medical Genetics

## 2024-01-01 ENCOUNTER — Encounter (HOSPITAL_COMMUNITY): Payer: Self-pay

## 2024-01-08 ENCOUNTER — Encounter: Payer: Self-pay | Admitting: Family Medicine

## 2024-01-08 ENCOUNTER — Ambulatory Visit: Admitting: Family Medicine

## 2024-01-08 VITALS — BP 131/84 | HR 81 | Ht 64.0 in | Wt 276.7 lb

## 2024-01-08 DIAGNOSIS — I10 Essential (primary) hypertension: Secondary | ICD-10-CM

## 2024-01-08 DIAGNOSIS — Z122 Encounter for screening for malignant neoplasm of respiratory organs: Secondary | ICD-10-CM

## 2024-01-08 DIAGNOSIS — Z114 Encounter for screening for human immunodeficiency virus [HIV]: Secondary | ICD-10-CM

## 2024-01-08 DIAGNOSIS — Z23 Encounter for immunization: Secondary | ICD-10-CM

## 2024-01-08 DIAGNOSIS — Z1159 Encounter for screening for other viral diseases: Secondary | ICD-10-CM

## 2024-01-08 DIAGNOSIS — F17210 Nicotine dependence, cigarettes, uncomplicated: Secondary | ICD-10-CM

## 2024-01-08 DIAGNOSIS — E782 Mixed hyperlipidemia: Secondary | ICD-10-CM

## 2024-01-08 DIAGNOSIS — E559 Vitamin D deficiency, unspecified: Secondary | ICD-10-CM | POA: Diagnosis not present

## 2024-01-08 DIAGNOSIS — R7303 Prediabetes: Secondary | ICD-10-CM

## 2024-01-08 MED ORDER — HYDROCHLOROTHIAZIDE 25 MG PO TABS
25.0000 mg | ORAL_TABLET | Freq: Every day | ORAL | 0 refills | Status: DC
Start: 2024-01-08 — End: 2024-04-02

## 2024-01-08 NOTE — Progress Notes (Signed)
 Established patient visit   Patient: Sabrina Fuentes   DOB: [birth year]   53 y.o. Female  MRN: [id number] Visit Date: 01/08/2024  Today's healthcare provider: Aden Agreste, MD   Chief Complaint  Patient presents with   Medical Management of Chronic Issues    Hypertension follow-up/BP Check   Hypertension    Pt was last seen 12/12/23 with elevated BP of 150/82. Treatment was to discontinue losartan /hydrochlorothiazide  combination and initiate hydrochlorothiazide  25 mg daily in the morning. She reports not monitoring at home with no symptoms to report and she does not smoke. She reports she was experiencing some dizziness taking rx during day but has now switched to taking at night   Subjective    Hypertension   HPI     Medical Management of Chronic Issues    Additional comments: Hypertension follow-up/BP Check        Hypertension    Additional comments: Pt was last seen 12/12/23 with elevated BP of 150/82. Treatment was to discontinue losartan /hydrochlorothiazide  combination and initiate hydrochlorothiazide  25 mg daily in the morning. She reports not monitoring at home with no symptoms to report and she does not smoke. She reports she was experiencing some dizziness taking rx during day but has now switched to taking at night      Last edited by Pasty Bongo, CMA on 01/08/2024  1:56 PM.       Discussed the use of AI scribe software for clinical note transcription with the patient, who gave verbal consent to proceed.  History of Present Illness   Sabrina Fuentes is a 53 year old female who presents for follow-up on blood pressure management.  She manages hypertension with HCTZ 25 mg daily at bedtime, which has resolved previous dizziness. She also manages prediabetes with metformin  and takes medication for cholesterol. She is addressing a vitamin D  deficiency and is interested in checking her vitamin D  levels and other labs. She is on Wegovy  for weight loss and  reports gradual weight loss. She has enough Wegovy  to last until July. She has had HIV tests in the past and discussed further screenings for HIV and hepatitis C. She experiences no current side effects from her medications and has no new health concerns.         Medications: Outpatient Medications Prior to Visit  Medication Sig   buPROPion  (WELLBUTRIN  SR) 150 MG 12 hr tablet Take 150 mg by mouth 2 (two) times daily.   buPROPion  (WELLBUTRIN  XL) 300 MG 24 hr tablet Take 300 mg by mouth daily.   diclofenac (VOLTAREN) 75 MG EC tablet Take 1 tablet by mouth 2 (two) times daily with a meal.   metFORMIN  (GLUCOPHAGE -XR) 750 MG 24 hr tablet Take 1 tablet (750 mg total) by mouth 2 (two) times daily after a meal.   pantoprazole  (PROTONIX ) 40 MG tablet Take 1 tablet (40 mg total) by mouth daily. TAKE 1 TABLET(40 MG) BY MOUTH DAILY   rosuvastatin  (CRESTOR ) 20 MG tablet Take 1 tablet (20 mg total) by mouth daily.   [START ON 01/10/2024] Semaglutide -Weight Management 0.5 MG/0.5ML SOAJ Inject 0.5 mg into the skin once a week.   Vitamin D , Ergocalciferol , (DRISDOL ) 1.25 MG (50000 UNIT) CAPS capsule Take 1 capsule (50,000 Units total) by mouth every 7 (seven) days.   [DISCONTINUED] hydrochlorothiazide  (HYDRODIURIL ) 25 MG tablet Take 1 tablet (25 mg total) by mouth daily.   levocetirizine (XYZAL ) 5 MG tablet Take 1 tablet (5 mg total) by mouth every evening.  No facility-administered medications prior to visit.    Review of Systems     Objective    BP 131/84 (BP Location: Left Arm, Patient Position: Sitting, Cuff Size: Normal)   Pulse 81   Ht 5\' 4"  (1.626 m)   Wt 276 lb 11.2 oz (125.5 kg)   SpO2 100%   BMI 47.50 kg/m    Physical Exam Vitals reviewed.  Constitutional:      General: She is not in acute distress.    Appearance: Normal appearance. She is well-developed. She is not diaphoretic.  HENT:     Head: Normocephalic and atraumatic.  Eyes:     General: No scleral icterus.     Conjunctiva/sclera: Conjunctivae normal.  Neck:     Thyroid: No thyromegaly.  Cardiovascular:     Rate and Rhythm: Normal rate and regular rhythm.     Heart sounds: Normal heart sounds. No murmur heard. Pulmonary:     Effort: Pulmonary effort is normal. No respiratory distress.     Breath sounds: Normal breath sounds. No wheezing, rhonchi or rales.  Musculoskeletal:     Cervical back: Neck supple.     Right lower leg: No edema.     Left lower leg: No edema.  Lymphadenopathy:     Cervical: No cervical adenopathy.  Skin:    General: Skin is warm and dry.     Findings: No rash.  Neurological:     Mental Status: She is alert and oriented to person, place, and time. Mental status is at baseline.  Psychiatric:        Mood and Affect: Mood normal.        Behavior: Behavior normal.      No results found for any visits on 01/08/24.  Assessment & Plan     Problem List Items Addressed This Visit       Cardiovascular and Mediastinum   Primary hypertension   Relevant Medications   hydrochlorothiazide  (HYDRODIURIL ) 25 MG tablet   Other Relevant Orders   Comprehensive metabolic panel with GFR     Other   Prediabetes   Relevant Orders   Hemoglobin A1c   Vitamin D  deficiency - new diagnoses   Relevant Orders   VITAMIN D  25 Hydroxy (Vit-D Deficiency, Fractures)   Smoking greater than 30 pack years- recently changed to vaping.   Relevant Orders   Ambulatory Referral for Lung Cancer Screening [REF832]   Mixed hyperlipidemia -new diagnosis   Relevant Medications   hydrochlorothiazide  (HYDRODIURIL ) 25 MG tablet   Other Relevant Orders   Comprehensive metabolic panel with GFR   Lipid panel   Other Visit Diagnoses       Need for hepatitis C screening test    -  Primary   Relevant Orders   Hepatitis C antibody     Encounter for screening for HIV       Relevant Orders   HIV Antibody (routine testing w rflx)     Need for pneumococcal vaccine       Relevant Orders    Pneumococcal conjugate vaccine 20-valent     Need for shingles vaccine       Relevant Orders   Varicella-zoster vaccine IM     Immunization due       Relevant Orders   Pneumococcal conjugate vaccine 20-valent   Varicella-zoster vaccine IM     Encounter for screening for lung cancer       Relevant Orders   Ambulatory Referral for Lung Cancer Screening [[id number]]  Hypertension Hypertension is well-controlled with the current medication regimen. Transition from losartan -HCTZ combination to HCTZ 25 mg at bedtime has resolved previous dizziness. Blood pressure readings are satisfactory. Electrolytes will be monitored to ensure no disruption in potassium or sodium levels. - Continue HCTZ 25 mg at bedtime. - Check electrolytes to monitor potassium and sodium levels.  Prediabetes Prediabetes is being managed with metformin . A1c levels will be checked to monitor glucose control. - Continue metformin  as prescribed. - Check A1c levels.  Vitamin D  deficiency Vitamin D  levels will be checked to ensure adequate supplementation. - Check vitamin D  levels.  General Health Maintenance She is due for routine vaccinations and screenings. Discussed the importance of shingles and pneumonia vaccinations. Explained potential side effects of the shingles vaccine, including redness and swelling at the injection site and feeling rundown after the second dose. Recommended scheduling the second shingles shot around a time when rest is possible. - Administer pneumonia and shingles vaccines. - Schedule second shingles shot in 2-6 months. - Perform lung cancer screening. - Screen for HIV and hepatitis C.       Return in about 2 months (around 03/09/2024) for weight f/u, With PCP, shingrix #2.       Aden Agreste, MD  [facility] [phone] (phone) [phone] (fax)  [facility]

## 2024-01-09 ENCOUNTER — Ambulatory Visit: Payer: Self-pay | Admitting: Family Medicine

## 2024-01-09 LAB — COMPREHENSIVE METABOLIC PANEL WITH GFR
ALT: 19 IU/L (ref 0–32)
AST: 14 IU/L (ref 0–40)
Albumin: 4.4 g/dL (ref 3.9–4.9)
Alkaline Phosphatase: 66 IU/L (ref 44–121)
BUN/Creatinine Ratio: 17 (ref 9–23)
BUN: 18 mg/dL (ref 6–24)
Bilirubin Total: 0.5 mg/dL (ref 0.0–1.2)
CO2: 23 mmol/L (ref 20–29)
Calcium: 10.1 mg/dL (ref 8.7–10.2)
Chloride: 100 mmol/L (ref 96–106)
Creatinine, Ser: 1.03 mg/dL — ABNORMAL HIGH (ref 0.57–1.00)
Globulin, Total: 2.2 g/dL (ref 1.5–4.5)
Glucose: 91 mg/dL (ref 70–99)
Potassium: 4.6 mmol/L (ref 3.5–5.2)
Sodium: 142 mmol/L (ref 134–144)
Total Protein: 6.6 g/dL (ref 6.0–8.5)
eGFR: 66 mL/min/{1.73_m2} (ref >59)

## 2024-01-09 LAB — LIPID PANEL
Chol/HDL Ratio: 2.8 ratio (ref 0.0–4.4)
Cholesterol, Total: 125 mg/dL (ref 100–199)
HDL: 44 mg/dL (ref >39)
LDL Chol Calc (NIH): 58 mg/dL (ref 0–99)
Triglycerides: 130 mg/dL (ref 0–149)
VLDL Cholesterol Cal: 23 mg/dL (ref 5–40)

## 2024-01-09 LAB — HIV ANTIBODY (ROUTINE TESTING W REFLEX): HIV Screen 4th Generation wRfx: NONREACTIVE

## 2024-01-09 LAB — HEPATITIS C ANTIBODY: Hep C Virus Ab: NONREACTIVE

## 2024-01-09 LAB — VITAMIN D 25 HYDROXY (VIT D DEFICIENCY, FRACTURES): Vit D, 25-Hydroxy: 85.9 ng/mL (ref 30.0–100.0)

## 2024-01-09 LAB — HEMOGLOBIN A1C
Est. average glucose Bld gHb Est-mCnc: 114 mg/dL
Hgb A1c MFr Bld: 5.6 % (ref 4.8–5.6)

## 2024-01-12 ENCOUNTER — Other Ambulatory Visit

## 2024-02-09 ENCOUNTER — Ambulatory Visit: Admitting: Family Medicine

## 2024-02-12 ENCOUNTER — Telehealth: Payer: Self-pay

## 2024-02-12 NOTE — Telephone Encounter (Signed)
 Copied from CRM [id number]. Topic: Clinical - Medication Prior Auth >> Feb 11, 2024  1:20 PM Shardie S wrote: Reason for CRM: Patient requesting PA for Wegovy  be sent to her insurance. She ask that when called, to let them know it is urgent so that they can process her request faster.  Contact # for [employer] [phone]  FYI.Aaron AasAaron AasShe states the company has her last name as [place].

## 2024-02-17 NOTE — Telephone Encounter (Signed)
 Noted - okay to refill if patient calls back with need for refill.

## 2024-02-18 ENCOUNTER — Telehealth: Payer: Self-pay | Admitting: Pharmacy Technician

## 2024-02-18 ENCOUNTER — Other Ambulatory Visit (HOSPITAL_COMMUNITY): Payer: Self-pay

## 2024-02-18 NOTE — Telephone Encounter (Signed)
 Pharmacy Patient Advocate Encounter   Received notification from CoverMyMeds that prior authorization for Wegovy  0.5MG /0.5ML auto-injectors is required/requested.   Insurance verification completed.   The patient is insured through [facility] MEDICAID .   Per test claim: Refill too soon. PA is not needed at this time. Medication was filled 02/16/24. Next eligible fill date is 03/08/24.

## 2024-02-18 NOTE — Telephone Encounter (Signed)
 Noted that a PA is not required for pt's Wegovy  as this time.

## 2024-02-20 DIAGNOSIS — M1711 Unilateral primary osteoarthritis, right knee: Secondary | ICD-10-CM | POA: Diagnosis not present

## 2024-02-24 ENCOUNTER — Other Ambulatory Visit (HOSPITAL_COMMUNITY): Payer: Self-pay

## 2024-02-25 ENCOUNTER — Ambulatory Visit (INDEPENDENT_AMBULATORY_CARE_PROVIDER_SITE_OTHER): Admitting: Family Medicine

## 2024-02-25 ENCOUNTER — Encounter: Payer: Self-pay | Admitting: Family Medicine

## 2024-02-25 DIAGNOSIS — R7303 Prediabetes: Secondary | ICD-10-CM | POA: Diagnosis not present

## 2024-02-25 DIAGNOSIS — I10 Essential (primary) hypertension: Secondary | ICD-10-CM

## 2024-02-25 DIAGNOSIS — L920 Granuloma annulare: Secondary | ICD-10-CM

## 2024-02-25 DIAGNOSIS — Z1231 Encounter for screening mammogram for malignant neoplasm of breast: Secondary | ICD-10-CM

## 2024-02-25 MED ORDER — CLOBETASOL PROPIONATE 0.05 % EX CREA: 1.0000 | TOPICAL_CREAM | Freq: Two times a day (BID) | CUTANEOUS | 0 refills | Status: AC

## 2024-02-25 MED ORDER — WEGOVY 1 MG/0.5ML ~~LOC~~ SOAJ: 1.0000 mg | SUBCUTANEOUS | 3 refills | Status: DC

## 2024-02-25 NOTE — Progress Notes (Addendum)
 Established Patient Office Visit  Introduced to nurse practitioner role and practice setting.  All questions answered.  Discussed provider/patient relationship and expectations.  Subjective   Patient ID: Sabrina Fuentes, female    DOB: 28-Nov-1970  Age: 53 y.o. MRN: [id number]  Chief Complaint  Patient presents with   Cyst    Patient reports that she has a knot on the back of her neck, states that it is not painful just concerned.  Stated it may be poor posture.  Reported it has been there for a long time.   Medication Management    Patient stated that she is on Wegovy  not sure if she is here to get an increase.  Had an appointment back in May and stated that she is due for her 2nd shingles shot but had a reaction looked like a burn, her skin peeled.  Not sure if she wants the second one today.  Lung Cancer Screening- ok to order Mammogram- ok to order Hepatitis B Vaccine- declined Cervical Cancer Screening- will in future   Discussed the use of AI scribe software for clinical note transcription with the patient, who gave verbal consent to proceed.  History of Present Illness Sabrina Fuentes is a 53 year old female with arthritis and prediabetes who presents with medication management issues and weight loss follow-up.  Weight mgmt: Has been on wegovy  0.5mg  weekly, has lost 40lbs since January, BMI down from 50.97 to 43.94. Improved eating habits, increased exercise, and use of her medication have contributed to this weight loss. She engages in water aerobics, which has been beneficial for her knee arthritis, and uses a vibration plate to help manage her arthritis symptoms. This medication has allowed her to improve on her weight loss journey and allow her to move better, which has helped with her knee arthritis as well.   She is experiencing issues with her medication coverage due to a change in her primary insurance from [employer] to [employer]. She has been on a medication regimen that  includes a 0.5 mg dose, which she is tolerating well without side effects. [employer] requires prior authorization for coverage, and she is concerned about a potential gap in medication access. She has been in communication with her pharmacist and the insurance company to resolve this issue.  Knee arthritis: managed by ortho; sometimes becomes severe enough to require a cortisone shot, as occurred a week ago. Her arthritis symptoms have improved, and she has been participating in physical therapy. An MRI was conducted this morning to investigate further..  She mentions a longstanding issue with a hump on her back, which she attributes to posture and possibly fatty tissue. It does not cause pain or discomfort, and she has not experienced neck pain associated with it. Co-worker commented on it - wondering if she should be concerned.  She has experienced a skin condition that was previously evaluated by a dermatologist, who performed a biopsy and diagnosed it as benign. The condition involves spreading bumps that do not itch or hurt but are bothersome in physical nature.  She has a history of prediabetes, but recent lab results indicate her A1c has returned to normal levels. She was previously on a vitamin D  prescription, which has normalized her levels, and she now takes a multivitamin with vitamin D .  She is currently on Wellbutrin , taking a total of 450 mg in the morning, and hydrochlorothiazide  for blood pressure management. Her blood pressure readings have been slightly elevated, particularly the diastolic number,  but she does not monitor it at home.   Shingles- Second dose due today. She felt unwell after receiving shingles and pneumonia vaccines, with localized skin peeling at the injection site.       02/25/2024    1:18 PM 12/12/2023    8:41 AM 09/01/2023    1:26 PM  Depression screen PHQ 2/9  Decreased Interest 0 1 1  Down, Depressed, Hopeless 0 1 1  PHQ - 2 Score 0 2 2  Altered sleeping 1 3 1    Tired, decreased energy 1 3 3   Change in appetite 0 0 0  Feeling bad or failure about yourself  0 0 0  Trouble concentrating 1 1 1   Moving slowly or fidgety/restless 0 0 0  Suicidal thoughts 0 0 0  PHQ-9 Score 3 9 7   Difficult doing work/chores Somewhat difficult Somewhat difficult        02/25/2024    1:18 PM 12/12/2023    8:41 AM 09/01/2023    1:26 PM 06/26/2023    1:12 PM  GAD 7 : Generalized Anxiety Score  Nervous, Anxious, on Edge 1 2 2 1   Control/stop worrying 1 1 2 1   Worry too much - different things 1 1 2 1   Trouble relaxing 1 2 2 1   Restless 1 3 0 0  Easily annoyed or irritable 2 2 2 2   Afraid - awful might happen 0 0 1 0  Total GAD 7 Score 7 11 11 6   Anxiety Difficulty Somewhat difficult Very difficult  Somewhat difficult     Review of Systems  All other systems reviewed and are negative.   Negative unless indicated in HPI   Objective:     BP (!) 118/93 (BP Location: Left Wrist, Patient Position: Sitting, Cuff Size: Normal)   Pulse 75   Temp 97.7 F (36.5 C) (Oral)   Ht 5' 4 (1.626 m)   Wt 256 lb (116.1 kg)   SpO2 100%   BMI 43.94 kg/m    Physical Exam Constitutional:      General: She is not in acute distress.    Appearance: Normal appearance. She is obese. She is not toxic-appearing or diaphoretic.  HENT:     Head: Normocephalic.     Nose: Nose normal. No congestion or rhinorrhea.     Mouth/Throat:     Mouth: Mucous membranes are moist.     Pharynx: Oropharynx is clear.  Eyes:     Extraocular Movements: Extraocular movements intact.     Pupils: Pupils are equal, round, and reactive to light.  Cardiovascular:     Rate and Rhythm: Normal rate and regular rhythm.     Pulses: Normal pulses.     Heart sounds: Murmur heard.     No friction rub. No gallop.     Comments: Murmur known Pulmonary:     Effort: No respiratory distress.     Breath sounds: No stridor. No wheezing, rhonchi or rales.  Chest:     Chest wall: No tenderness.   Musculoskeletal:     Cervical back: Deformity present. No swelling, edema, rigidity, spasms, tenderness or bony tenderness. No pain with movement. Normal range of motion.     Right lower leg: No edema.     Left lower leg: No edema.     Comments: Cervical spine positive for hump, appears to be combination of adipose tissue and posture related hunch  Skin:    General: Skin is warm and dry.     Capillary Refill:  Capillary refill takes less than 2 seconds.  Neurological:     General: No focal deficit present.     Mental Status: She is alert and oriented to person, place, and time. Mental status is at baseline.  Psychiatric:        Mood and Affect: Mood normal.        Behavior: Behavior normal.        Thought Content: Thought content normal.        Judgment: Judgment normal.    No results found for any visits on 02/25/24.    The ASCVD Risk score (Arnett DK, et al., 2019) failed to calculate for the following reasons:   The valid total cholesterol range is 130 to 320 mg/dL    Assessment & Plan:  Morbid obesity (HCC) -     Wegovy ; Inject 1 mg into the skin once a week.  Dispense: 2 mL; Refill: 3  Primary hypertension  Prediabetes  Granuloma annulare -     Clobetasol  Propionate; Apply 1 Application topically 2 (two) times daily.  Dispense: 30 g; Refill: 0  Screening mammogram for breast cancer -     3D Screening Mammogram, Left and Right; Future     Assessment and Plan Assessment & Plan Morbid Obesity She lost 40 pounds since February, improving arthritis symptoms and health markers.  BMI down from 50.97 to 43.94; no GI concerns, normal Bms.  Actively enrolled and working with weight watchers to assist in diet, exericse, and lifestyle changes. Pt is tolerating medication, and has hugely benefited her. Has allowed her to improve her general mobility, food choices. A1C improved as well to 5.6.  I believe Willine would benefit with staying on medication, which will help with  her co-morbidities, weight loss, and arthritis pain in knees. Tolerates Wegovy  0.5 mg, increase to 1 mg weekly after current supply. - Follow up in 3 months for lab recheck  Knee Osteoarthritis Improved with weight loss and exercise. Recent cortisone injection for severe pain by ortho. - Continue weight loss and exercise, including water aerobics. - Monitor symptoms and consider further interventions.  Hypertension Slightly elevated diastolic pressure. Tolerates hydrochlorothiazide  well. GOAL<130/80 Difficulty obtaining accurate BP with cuff d/t arm habitus. Forearm pressure taken. - Encourage home blood pressure monitoring. - Continue hydrochlorothiazide  25 mg daily.  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 - Advise she can trial the use of vitamin E/ scar removal products to affected areas. - discussed she can see derm again for cryotherapy - continue use of emollients - limited scented products.  General Health Maintenance Due for mammogram and Pap smear.  Wishes to delay second shingles shot due to systemic symptoms of flu like symptoms post injection, with redness around injection site - discussed these symptoms are normal post immunization  - pt would like to hold until next appt. - Order [facility]ogram at [facility]. - Encourage scheduling of Pap smear. - Delay second shingles shot until next appointment.   Return in about 3 months (around 05/27/2024) for weight check.   I, Curtis DELENA Boom, FNP, have reviewed all documentation for this visit. The documentation on 02/25/24 for the exam, diagnosis, procedures, and orders are all accurate and complete.   Curtis DELENA Boom, FNP

## 2024-03-04 ENCOUNTER — Telehealth: Payer: Self-pay | Admitting: Family Medicine

## 2024-03-04 NOTE — Telephone Encounter (Signed)
 [facility] pharmacy is requesting refill Semaglutide -Weight Management 0.5 MG/0.5ML SOAJ   Please advise

## 2024-03-04 NOTE — Telephone Encounter (Signed)
 Rx declined LOV was advised to titrate up to 1 MG.

## 2024-03-10 ENCOUNTER — Ambulatory Visit: Admitting: Family Medicine

## 2024-03-12 ENCOUNTER — Telehealth: Payer: Self-pay

## 2024-03-12 ENCOUNTER — Other Ambulatory Visit (HOSPITAL_COMMUNITY): Payer: Self-pay

## 2024-03-12 NOTE — Telephone Encounter (Signed)
 Pharmacy Patient Advocate Encounter   Received notification from CoverMyMeds that prior authorization for Wegovy  0.25mg /0.26ml Pen is required/requested.   Insurance verification completed.   The patient is insured through [facility] MEDICAID .   Per test claim: The current 28 day co-pay is, $4.00.  No PA needed at this time. This test claim was processed through [facility]- copay amounts may vary at other pharmacies due to pharmacy/plan contracts, or as the patient moves through the different stages of their insurance plan.

## 2024-03-16 NOTE — Telephone Encounter (Signed)
 Copied from CRM [id number]. Topic: Clinical - Medication Prior Auth >> Mar 16, 2024 12:12 PM Wess RAMAN wrote: Reason for CRM: Patient states [facility] Medicaid is not her primary insurance. She would like the prior authorization sent to [employer] instead for Semaglutide -Weight Management (WEGOVY ) 1 MG/0.5ML SOAJ and  Semaglutide -Weight Management 0.5 MG/0.5ML SOAJ  [facility]:[phone] Patient Callback #: [phone]

## 2024-03-19 ENCOUNTER — Other Ambulatory Visit: Payer: Self-pay | Admitting: Family Medicine

## 2024-03-19 ENCOUNTER — Other Ambulatory Visit (HOSPITAL_COMMUNITY): Payer: Self-pay

## 2024-03-19 NOTE — Telephone Encounter (Signed)
 Copied from CRM #[id number]. Topic: Clinical - Medication Refill >> Mar 19, 2024 10:14 AM Carla L wrote: Medication: hydrochlorothiazide  (HYDRODIURIL ) 25 MG tablet metFORMIN  (GLUCOPHAGE -XR) 750 MG 24 hr tablet  Has the patient contacted their pharmacy? Yes  This is the patient's preferred pharmacy:  [facility] #[id number] [place], [place] - [address] AT [facility] OF [address] [place] [address] Phone: [phone] Fax: [phone]  Is this the correct pharmacy for this prescription? Yes  Has the prescription been filled recently? No  Is the patient out of the medication? No  Has the patient been seen for an appointment in the last year OR does the patient have an upcoming appointment? Yes  Can we respond through MyChart? Yes  Agent: Please be advised that Rx refills may take up to 3 business days. We ask that you follow-up with your pharmacy.

## 2024-03-19 NOTE — Telephone Encounter (Signed)
 Patient calling for update. Per patient, she is enrolled in weight watchers program. Patient will reach out to insurance to ensure they are aware she is enrolled. Patient requesting prior authorization be submitted through [employer]

## 2024-03-19 NOTE — Telephone Encounter (Signed)
 Pharmacy Patient Advocate Encounter   Received notification from Pt Calls Messages that prior authorization for Wegovy  is required/requested.   Insurance verification completed.   The patient is insured through [employer] .  Patient must enroll in Weight Watchers thru [facility] for Obesity drug coverage.

## 2024-03-19 NOTE — Telephone Encounter (Signed)
Added to note

## 2024-03-22 NOTE — Telephone Encounter (Signed)
 Rx 06/27/23 #180 3RF- 1 year supply- too soon Requested Prescriptions  Pending Prescriptions Disp Refills   metFORMIN  (GLUCOPHAGE -XR) 750 MG 24 hr tablet 180 tablet 3    Sig: Take 1 tablet (750 mg total) by mouth 2 (two) times daily after a meal.     Endocrinology:  Diabetes - Biguanides Failed - 03/22/2024 10:50 AM      Failed - Cr in normal range and within 360 days    Creatinine  Date Value Ref Range Status  09/29/2014 0.46 (L) 0.60 - 1.30 mg/dL Final   Creatinine, Ser  Date Value Ref Range Status  01/08/2024 1.03 (H) 0.57 - 1.00 mg/dL Final         Failed - CBC within normal limits and completed in the last 12 months    WBC  Date Value Ref Range Status  12/24/2022 4.3 3.4 - 10.8 x10E3/uL Final  06/05/2020 8.2 4.0 - 10.5 K/uL Final   RBC  Date Value Ref Range Status  12/24/2022 4.76 3.77 - 5.28 x10E6/uL Final  06/05/2020 4.17 3.87 - 5.11 MIL/uL Final   Hemoglobin  Date Value Ref Range Status  12/24/2022 14.5 11.1 - 15.9 g/dL Final   Hematocrit  Date Value Ref Range Status  12/24/2022 43.6 34.0 - 46.6 % Final   MCHC  Date Value Ref Range Status  12/24/2022 33.3 31.5 - 35.7 g/dL Final  89/88/7978 66.3 30.0 - 36.0 g/dL Final   [facility]  Date Value Ref Range Status  12/24/2022 30.5 26.6 - 33.0 pg Final  06/05/2020 31.7 26.0 - 34.0 pg Final   MCV  Date Value Ref Range Status  12/24/2022 92 79 - 97 fL Final  10/03/2014 82 80 - 100 fL Final   No results found for: PLTCOUNTKUC, LABPLAT, POCPLA RDW  Date Value Ref Range Status  12/24/2022 12.9 11.7 - 15.4 % Final  10/03/2014 16.1 (H) 11.5 - 14.5 % Final         Passed - HBA1C is between 0 and 7.9 and within 180 days    Hgb A1c MFr Bld  Date Value Ref Range Status  01/08/2024 5.6 4.8 - 5.6 % Final    Comment:             Prediabetes: 5.7 - 6.4          Diabetes: >6.4          Glycemic control for adults with diabetes: <7.0          Passed - eGFR in normal range and within 360 days    EGFR (African  American)  Date Value Ref Range Status  09/29/2014 >60 >58mL/min Final  03/27/2014 >60  Final   GFR calc Af Amer  Date Value Ref Range Status  07/08/2017 >60 >60 mL/min Final    Comment:    (NOTE) The eGFR has been calculated using the CKD EPI equation. This calculation has not been validated in all clinical situations. eGFR's persistently <60 mL/min signify possible Chronic Kidney Disease.    EGFR (Non-African Amer.)  Date Value Ref Range Status  09/29/2014 >60 >20mL/min Final    Comment:    eGFR values <70mL/min/1.73 m2 may be an indication of chronic kidney disease (CKD). Calculated eGFR, using the MRDR Study equation, is useful in  patients with stable renal function. The eGFR calculation will not be reliable in acutely ill patients when serum creatinine is changing rapidly. It is not useful in patients on dialysis. The eGFR calculation may not be applicable to patients  at the low and high extremes of body sizes, pregnant women, and vegetarians.   03/27/2014 >60  Final    Comment:    eGFR values <38mL/min/1.73 m2 may be an indication of chronic kidney disease (CKD). Calculated eGFR is useful in patients with stable renal function. The eGFR calculation will not be reliable in acutely ill patients when serum creatinine is changing rapidly. It is not useful in  patients on dialysis. The eGFR calculation may not be applicable to patients at the low and high extremes of body sizes, pregnant women, and vegetarians.    GFR, Estimated  Date Value Ref Range Status  06/05/2020 >60 >60 mL/min Final   eGFR  Date Value Ref Range Status  01/08/2024 66 >59 mL/min/1.73 Final         Passed - B12 Level in normal range and within 720 days    Vitamin B-12  Date Value Ref Range Status  12/24/2022 641 232 - 1,245 pg/mL Final         Passed - Valid encounter within last 6 months    Recent Outpatient Visits           3 weeks ago Morbid obesity [facility])   [facility] [place], Curtis LABOR, FNP   2 months ago Need for hepatitis C screening test   [facility] [place], Jon HERO, MD   3 months ago Primary hypertension   [place] [place], Jon HERO, MD

## 2024-03-25 ENCOUNTER — Other Ambulatory Visit: Payer: Self-pay | Admitting: Family Medicine

## 2024-03-25 DIAGNOSIS — K2100 Gastro-esophageal reflux disease with esophagitis, without bleeding: Secondary | ICD-10-CM

## 2024-03-25 DIAGNOSIS — E782 Mixed hyperlipidemia: Secondary | ICD-10-CM

## 2024-03-25 MED ORDER — BUPROPION HCL ER (XL) 300 MG PO TB24: 300.0000 mg | ORAL_TABLET | Freq: Every day | ORAL | 1 refills | Status: AC

## 2024-03-25 MED ORDER — ROSUVASTATIN CALCIUM 20 MG PO TABS: 20.0000 mg | ORAL_TABLET | Freq: Every day | ORAL | 3 refills | Status: AC

## 2024-03-25 MED ORDER — BUPROPION HCL ER (SR) 150 MG PO TB12: 150.0000 mg | ORAL_TABLET | Freq: Two times a day (BID) | ORAL | 1 refills | Status: DC

## 2024-03-25 MED ORDER — PANTOPRAZOLE SODIUM 40 MG PO TBEC: 40.0000 mg | DELAYED_RELEASE_TABLET | Freq: Every day | ORAL | 1 refills | Status: AC

## 2024-03-25 MED ORDER — METFORMIN HCL ER 750 MG PO TB24: 750.0000 mg | ORAL_TABLET | Freq: Two times a day (BID) | ORAL | 3 refills | Status: DC

## 2024-03-25 NOTE — Telephone Encounter (Signed)
 [facility] faxed refill request for the following medications:   levocetirizine (XYZAL ) 5 MG tablet    metFORMIN  (GLUCOPHAGE -XR) 750 MG 24 hr tablet    buPROPion  XL tab (did not say what mg)   rosuvastatin  (CRESTOR ) 20 MG tablet    buPROPion  xl tab (did not say what mg and was on the refill request 2 times)   pantoprazole  (PROTONIX ) 40 MG tablet    hydrochlorothiazide  (HYDRODIURIL ) 25 MG tablet    Celecoxib cap   Meloxicam tab   Please advise.

## 2024-03-26 ENCOUNTER — Other Ambulatory Visit: Payer: Self-pay | Admitting: Family Medicine

## 2024-03-26 DIAGNOSIS — F339 Major depressive disorder, recurrent, unspecified: Secondary | ICD-10-CM

## 2024-03-26 MED ORDER — BUPROPION HCL ER (SR) 150 MG PO TB12: 150.0000 mg | ORAL_TABLET | Freq: Every day | ORAL | 1 refills | Status: AC

## 2024-03-30 ENCOUNTER — Telehealth: Payer: Self-pay | Admitting: Family Medicine

## 2024-03-30 NOTE — Telephone Encounter (Signed)
 Copied from CRM #[id number]. Topic: Clinical - Medication Prior Auth >> Mar 30, 2024 11:39 AM Sabrina Fuentes wrote: Reason for CRM: Patient requesting for office manager to give her a call back regarding prior auth patient stated she left several messages but no one has called her back please follow up

## 2024-03-31 ENCOUNTER — Other Ambulatory Visit (HOSPITAL_COMMUNITY): Payer: Self-pay

## 2024-04-02 ENCOUNTER — Other Ambulatory Visit: Payer: Self-pay | Admitting: Family Medicine

## 2024-04-02 DIAGNOSIS — I10 Essential (primary) hypertension: Secondary | ICD-10-CM

## 2024-04-02 MED ORDER — HYDROCHLOROTHIAZIDE 25 MG PO TABS: 25.0000 mg | ORAL_TABLET | Freq: Every day | ORAL | 1 refills | Status: DC

## 2024-04-05 ENCOUNTER — Other Ambulatory Visit: Payer: Self-pay | Admitting: Family Medicine

## 2024-04-05 ENCOUNTER — Telehealth: Payer: Self-pay

## 2024-04-05 DIAGNOSIS — I10 Essential (primary) hypertension: Secondary | ICD-10-CM

## 2024-04-05 NOTE — Telephone Encounter (Signed)
 Copied from CRM [id number]. Topic: Clinical - Medication Refill >> Apr 05, 2024 11:50 AM Everette C wrote: Medication: hydrochlorothiazide  (HYDRODIURIL ) 25 MG tablet  Has the patient contacted their pharmacy? Yes (Agent: If no, request that the patient contact the pharmacy for the refill. If patient does not wish to contact the pharmacy document the reason why and proceed with request.) (Agent: If yes, when and what did the pharmacy advise?)  This is the patient's preferred pharmacy:  [facility] #[id number] [place], [place] - [address] AT [facility] OF [address] [place] [address] Phone: [phone] Fax: [phone]  Is this the correct pharmacy for this prescription? Yes If no, delete pharmacy and type the correct one.   Has the prescription been filled recently? Yes  Is the patient out of the medication? Yes  Has the patient been seen for an appointment in the last year OR does the patient have an upcoming appointment? Yes  Can we respond through MyChart? No  Agent: Please be advised that Rx refills may take up to 3 business days. We ask that you follow-up with your pharmacy.

## 2024-04-05 NOTE — Telephone Encounter (Signed)
 Called [facility]. Updated prescription to 300mg  plus 150mg  to totally 450mg  daily.

## 2024-04-05 NOTE — Telephone Encounter (Signed)
 Pharmacy needs to confirm what dose patient is on. Prescription for XL 300 and 150 SR   Copied from CRM #[id number]. Topic: Clinical - Prescription Issue >> Apr 05, 2024  9:52 AM Rosaria BRAVO wrote: Reason for CRM: Leeroy from [facility] called reporting that she has questions about the pt's buPROPion  (WELLBUTRIN  SR) 150 MG 12 hr tablet.   Please advise: [phone]  Reference: [id number]

## 2024-04-08 MED ORDER — HYDROCHLOROTHIAZIDE 25 MG PO TABS: 25.0000 mg | ORAL_TABLET | Freq: Every day | ORAL | 1 refills | Status: DC

## 2024-04-08 NOTE — Telephone Encounter (Signed)
 Rx 04/02/24 #90 1RF- too soon Requested Prescriptions  Pending Prescriptions Disp Refills   hydrochlorothiazide  (HYDRODIURIL ) 25 MG tablet 90 tablet 1    Sig: Take 1 tablet (25 mg total) by mouth daily.     Cardiovascular: Diuretics - Thiazide Failed - 04/08/2024 12:10 PM      Failed - Cr in normal range and within 180 days    Creatinine  Date Value Ref Range Status  09/29/2014 0.46 (L) 0.60 - 1.30 mg/dL Final   Creatinine, Ser  Date Value Ref Range Status  01/08/2024 1.03 (H) 0.57 - 1.00 mg/dL Final         Failed - Last BP in normal range    BP Readings from Last 1 Encounters:  02/25/24 (!) 118/93         Passed - K in normal range and within 180 days    Potassium  Date Value Ref Range Status  01/08/2024 4.6 3.5 - 5.2 mmol/L Final  09/29/2014 4.3 3.5 - 5.1 mmol/L Final         Passed - Na in normal range and within 180 days    Sodium  Date Value Ref Range Status  01/08/2024 142 134 - 144 mmol/L Final  09/29/2014 140 136 - 145 mmol/L Final         Passed - Valid encounter within last 6 months    Recent Outpatient Visits           1 month ago Morbid obesity [facility])   [place] [facility] [place], Curtis LABOR, FNP   3 months ago Need for hepatitis C screening test   [facility] [place], Jon HERO, MD   3 months ago Primary hypertension   [place] [facility] [place], Jon HERO, MD       Future Appointments             In 1 month W[place]born, Barnie, NP [facility]

## 2024-04-13 ENCOUNTER — Telehealth: Payer: Self-pay | Admitting: Family Medicine

## 2024-04-13 ENCOUNTER — Other Ambulatory Visit: Payer: Self-pay

## 2024-04-13 DIAGNOSIS — I10 Essential (primary) hypertension: Secondary | ICD-10-CM

## 2024-04-13 MED ORDER — HYDROCHLOROTHIAZIDE 25 MG PO TABS
25.0000 mg | ORAL_TABLET | Freq: Every day | ORAL | 2 refills | Status: AC
Start: 2024-04-13 — End: ?

## 2024-04-13 NOTE — Telephone Encounter (Signed)
 Will refill hydrochlorothiazide . Pt not on the other medication request, nor on her medicine list.

## 2024-04-13 NOTE — Telephone Encounter (Signed)
 [facility] faxed refill request for the following medications:   hydrochlorothiazide  (HYDRODIURIL ) 25 MG tablet   Celecoxib cap  Meloxicam tab  levocetirizine (XYZAL ) 5 MG tablet    Please advise.

## 2024-05-22 NOTE — Progress Notes (Deleted)
   Cardiology Clinic Note   Date: 05/22/2024 ID: Rachna, Schonberger Mar 07, 1971, MRN [id number]  Primary Cardiologist:  Redell Cave, MD  Chief Complaint   Venda LITTIE Michel is a 53 y.o. female who presents to the clinic today for ***  Patient Profile   Sabrina Fuentes is followed by Dr. Cave for the history outlined below.      Past medical history significant for: Mitral valve/aortic valve insufficiency. Echo 01/10/2023: EF 60 to 65%.  No RWMA.  Moderate asymmetric LVH of the septal segment, LVOT gradient 33 mmHg, no change with Valsalva.  Grade I DD.  Normal RV size/function.  Mild MR.  Mild to moderate AI.  Aortic valve sclerosis without stenosis. Hypertension. Hyperlipidemia. Lipid panel 01/08/2024: LDL 58, HDL 44, TG 130, total 125. GERD. Obesity. Tobacco abuse.  In summary, patient was first evaluated by Dr. Cave on 12/16/2022 for heart murmur.  Patient reported she was told by PCP she had a heart murmur a year and a half prior.  She denied any cardiac symptoms.  She was working on quitting smoking and weight loss.  Echo demonstrated mild MR and mild to moderate AI.  Upon follow-up in June 2024 she reported she had quit smoking.     History of Present Illness    Today, patient ***  Mitral valve/aortic valve insufficiency Echo May 2024 demonstrated normal LV/RV function, Grade I DD, mild MR, mild to moderate AI.  Patient*** - Repeat echo as clinically indicated.  Hypertension BP today*** - Continue HCTZ.  Hyperlipidemia LDL 58 May 2025: At goal. - Continue Crestor .  Tobacco abuse Patient***  ROS: All other systems reviewed and are otherwise negative except as noted in History of Present Illness.  EKGs/Labs Reviewed        01/08/2024: ALT 19; AST 14; BUN 18; Creatinine, Ser 1.03; Potassium 4.6; Sodium 142   No results found for requested labs within last 365 days.   No results found for requested labs within last 365 days.   No results found  for requested labs within last 365 days.  ***  Risk Assessment/Calculations    {Does this patient have ATRIAL FIBRILLATION?:[phone]} No BP recorded.  {Refresh Note OR Click here to enter BP  :1}***        Physical Exam    VS:  There were no vitals taken for this visit. , BMI There is no height or weight on file to calculate BMI.  GEN: Well nourished, well developed, in no acute distress. Neck: No JVD or carotid bruits. Cardiac: *** RRR. *** No murmur. No rubs or gallops.   Respiratory:  Respirations regular and unlabored. Clear to auscultation without rales, wheezing or rhonchi. GI: Soft, nontender, nondistended. Extremities: Radials/DP/PT 2+ and equal bilaterally. No clubbing or cyanosis. No edema ***  Skin: Warm and dry, no rash. Neuro: Strength intact.  Assessment & Plan   ***  Disposition: ***     {Are you ordering a CV Procedure (e.g. stress test, cath, DCCV, TEE, etc)?   Press F2        :[id number]}   Signed, Barnie HERO. Natavia Sublette, DNP, NP-C

## 2024-05-24 ENCOUNTER — Ambulatory Visit: Attending: Student | Admitting: Student

## 2024-05-27 ENCOUNTER — Ambulatory Visit: Admitting: Family Medicine

## 2024-06-16 ENCOUNTER — Other Ambulatory Visit: Payer: Self-pay | Admitting: Medical Genetics

## 2024-06-16 DIAGNOSIS — Z006 Encounter for examination for normal comparison and control in clinical research program: Secondary | ICD-10-CM

## 2024-06-21 ENCOUNTER — Encounter: Admitting: Family Medicine

## 2024-06-21 NOTE — Progress Notes (Signed)
 [place]   Thought she was making an appt with PCP to get labs ordered  Advised to call office for appt.  Patient acknowledged agreement and understanding of the plan.

## 2024-06-22 ENCOUNTER — Encounter: Payer: Self-pay | Admitting: Family Medicine

## 2024-06-22 ENCOUNTER — Ambulatory Visit (INDEPENDENT_AMBULATORY_CARE_PROVIDER_SITE_OTHER): Admitting: Family Medicine

## 2024-06-22 VITALS — BP 128/79 | HR 85 | Wt 238.2 lb

## 2024-06-22 DIAGNOSIS — E782 Mixed hyperlipidemia: Secondary | ICD-10-CM

## 2024-06-22 DIAGNOSIS — R233 Spontaneous ecchymoses: Secondary | ICD-10-CM

## 2024-06-22 DIAGNOSIS — F339 Major depressive disorder, recurrent, unspecified: Secondary | ICD-10-CM

## 2024-06-22 DIAGNOSIS — L603 Nail dystrophy: Secondary | ICD-10-CM | POA: Diagnosis not present

## 2024-06-22 DIAGNOSIS — R5383 Other fatigue: Secondary | ICD-10-CM

## 2024-06-22 DIAGNOSIS — I10 Essential (primary) hypertension: Secondary | ICD-10-CM | POA: Diagnosis not present

## 2024-06-22 DIAGNOSIS — R7303 Prediabetes: Secondary | ICD-10-CM

## 2024-06-22 DIAGNOSIS — K2100 Gastro-esophageal reflux disease with esophagitis, without bleeding: Secondary | ICD-10-CM

## 2024-06-22 DIAGNOSIS — L659 Nonscarring hair loss, unspecified: Secondary | ICD-10-CM

## 2024-06-22 DIAGNOSIS — F17290 Nicotine dependence, other tobacco product, uncomplicated: Secondary | ICD-10-CM

## 2024-06-22 DIAGNOSIS — M1711 Unilateral primary osteoarthritis, right knee: Secondary | ICD-10-CM

## 2024-06-22 DIAGNOSIS — N951 Menopausal and female climacteric states: Secondary | ICD-10-CM

## 2024-06-22 NOTE — Progress Notes (Signed)
 Acute Office Visit  Introduced to nurse practitioner role and practice setting.  All questions answered.  Discussed provider/patient relationship and expectations.   Subjective:     Patient ID: Sabrina Fuentes, female    DOB: 1971-08-15, 53 y.o.   MRN: [id number]  Chief Complaint  Patient presents with   Bleeding/Bruising   Alopecia    Discussed the use of AI scribe software for clinical note transcription with the patient, who gave verbal consent to proceed.  History of Present Illness Sabrina Fuentes is a 53 year old female who presents with unexplained bruising, hair loss, and fatigue.  She has been experiencing significant bruising and hair loss over the past six weeks. The bruises do not resolve, and her skin is very thin, making it prone to tearing. She attributes some of the hair loss to her ongoing weight loss journey, during which she has lost nearly 60 pounds since January. She is currently on Zepbound , prescribed through [employer], at a dose of 5 mg, and has previously been on Wegovy .  She has a history of knee arthritis, described as 'bone on bone.' She has received cortisone injections in the past, which were effective, but the most recent injection a month ago did not provide relief. She has tried physical therapy and is considering further interventions for her knee pain.- managed by [facility].  She experiences fatigue and headaches. She self-diagnosed with iron deficiency after using a home test kit, which indicated no iron in her body. She started taking liquid iron but was advised to stop pending further evaluation. She also reports cold extremities, particularly her feet and hands, which require external heat sources to maintain comfort.  She mentions a watery, odorless vaginal discharge, which she has not experienced in years. She still has regular menstrual periods and is not considered postmenopausal. .  HPI  ROS      Objective:    BP 128/79 (BP  Location: Left Arm, Patient Position: Sitting, Cuff Size: Large)   Pulse 85   Wt 238 lb 3.2 oz (108 kg)   LMP 05/22/2024 (Approximate)   SpO2 99%   BMI 40.89 kg/m    Physical Exam Constitutional:      General: She is not in acute distress.    Appearance: Normal appearance. She is obese. She is not toxic-appearing or diaphoretic.  HENT:     Head: Normocephalic.     Nose: Nose normal.     Mouth/Throat:     Mouth: Mucous membranes are moist.     Pharynx: Oropharynx is clear.  Eyes:     Extraocular Movements: Extraocular movements intact.     Pupils: Pupils are equal, round, and reactive to light.  Cardiovascular:     Rate and Rhythm: Normal rate and regular rhythm.     Pulses: Normal pulses.          Radial pulses are 2+ on the right side and 2+ on the left side.       Posterior tibial pulses are 2+ on the right side and 2+ on the left side.     Heart sounds: Normal heart sounds. No murmur heard.    No friction rub. No gallop.  Pulmonary:     Effort: No respiratory distress.     Breath sounds: No stridor. No wheezing, rhonchi or rales.  Chest:     Chest wall: No tenderness.  Musculoskeletal:     Right lower leg: No edema.     Left lower leg:  No edema.  Skin:    General: Skin is warm and dry.     Capillary Refill: Capillary refill takes less than 2 seconds.     Findings: Bruising present.     Comments: Brittle nails, pulses palpable, but cooler hands and feet. No color changes, no lesions present.  Neurological:     General: No focal deficit present.     Mental Status: She is alert and oriented to person, place, and time. Mental status is at baseline.  Psychiatric:        Mood and Affect: Mood normal.        Behavior: Behavior normal.        Thought Content: Thought content normal.        Judgment: Judgment normal.     No results found for any visits on 06/22/24.      Assessment & Plan:  Assessment and Plan Assessment & Plan Easy bruising - Reports easy  bruising - associated with delayed bruise healing, hair loss,  brittle nails, cold hands and feet - potentially related to recent dietary changes and weight loss on tirzepatide  - pt took at home iron test - showed she has low iron - denies chest pain, or DOB/Sob during exam, but will fatigue easily - was taking supplements but stopped recently for lab testing today - Anemia vs iron deficiency vs thyroid changes vs  nutritional deficit on GLP-1  - Order comprehensive lab work including iron studies, thyroid function tests, vitamin D , vitamin B12, metabolic panel, and A1c. - Consider referral to hematology based on labs - Based on labs will plan supplementation accordingly  Hair loss - potentially related to weight loss and nutritional changes.  On weight loss therapy with Zepbound , which may contribute to nutritional deficits. - CMP, TSH, CBC, iron, b12, vitamin D ,   Fatigue  Reports fatigue and imbalance, possibly related to nutritional deficiencies, anemia, or other metabolic issues. Cold extremities and peeling nails noted, which could be related to iron deficiency or other nutritional deficits. Denies palpitations, chest pain - CBC, CMP, Iron, Vitamin D , B12, TSH - ensure adequate water intake - sleep hygiene - exercise as tolerated weekly - Tylenol  or ibuprofen for headaches - if headaches worsen follow up for further assessment, appear more tension/ decreased snergy at this time.   Right knee osteoarthritis - Managed by [facility] - Recent MRI, plans for potential surgery versus nerve ablation  - Has worked with PT - Continue weight loss efforts.  Morbid Obesity On Zepbound  for weight loss, lost 60 pounds since January.  BMI today = 40.89, down from 50.97 -  Currently on Zepbound  5 mg weekly - Weight loss contributing to improved joint health - Ensure at least 80 grams of protein per day - ensure adequate water and electrolytes - will check B12 and vitamin D  -Medication  Management by [employer] through her work- [employer]  Prediabetes - repeat A1c - Previous 5.6% - on Zepbound  for Morbid Obesity - Continue to make conscious decisions for well balanced diet smaller portions with increase protein, fruits, veggies, water as drink of choice, decrease starches, processed foods, and saturated fats. Purposeful, weekly exercise   Hypertension - Chronic, controlled - continue hydrochlorothiazide .  Hyperlipidemia - chronic, controlled - check lipid panel with labs today - continue Crestor .  Gastroesophageal reflux disease (GERD) - Chronic, controlled - Contonie Protonix .  Depression -Wellbutrin , current dosage 450 mg daily.  Perimenopausal - continues to have monthly period - recent change in discharge, non bothersome, no  itching, dysuria, or odor - most likely ph changes - keep hydrated - non scented detergents and soaps. - consider vaginal swab in continues  Nicotine Use, Vaping - continue focus on cessation    Problem List Items Addressed This Visit       Cardiovascular and Mediastinum   Primary hypertension   Relevant Orders   Comprehensive metabolic panel with GFR     Digestive   Gastroesophageal reflux disease with esophagitis without hemorrhage     Other   Morbid obesity (HCC)   Relevant Medications   tirzepatide  (ZEPBOUND ) 5 MG/0.5ML Pen   Depression, recurrent   Prediabetes   Relevant Orders   Hemoglobin A1c   Mixed hyperlipidemia -new diagnosis   Relevant Orders   Lipid panel   Vaping nicotine dependence, tobacco product   Other Visit Diagnoses       Easy bruising    -  Primary   Relevant Orders   CBC with Differential/Platelet   Iron, TIBC and Ferritin Panel   TSH   Vitamin B12     Brittle nails         Hair thinning       Relevant Orders   TSH   Vitamin B12   VITAMIN D  25 Hydroxy (Vit-D Deficiency, Fractures)     Fatigue, unspecified type         Perimenopausal         Primary osteoarthritis of right  knee           No orders of the defined types were placed in this encounter.   Return in about 3 months (around 09/22/2024) for chronic disease mgmt.  Curtis DELENA Boom, FNP  I, Curtis DELENA Boom, FNP, have reviewed all documentation for this visit. The documentation on 06/22/24 for the exam, diagnosis, procedures, and orders are all accurate and complete.

## 2024-06-23 ENCOUNTER — Ambulatory Visit: Payer: Self-pay | Admitting: Family Medicine

## 2024-06-23 LAB — COMPREHENSIVE METABOLIC PANEL WITH GFR
ALT: 16 IU/L (ref 0–32)
AST: 10 IU/L (ref 0–40)
Albumin: 4.2 g/dL (ref 3.9–4.9)
Alkaline Phosphatase: 62 IU/L (ref 41–116)
BUN/Creatinine Ratio: 17 (ref 9–23)
BUN: 14 mg/dL (ref 6–24)
Bilirubin Total: 0.5 mg/dL (ref 0.0–1.2)
CO2: 23 mmol/L (ref 20–29)
Calcium: 9.2 mg/dL (ref 8.7–10.2)
Chloride: 104 mmol/L (ref 96–106)
Creatinine, Ser: 0.82 mg/dL (ref 0.57–1.00)
Globulin, Total: 2.2 g/dL (ref 1.5–4.5)
Glucose: 88 mg/dL (ref 70–99)
Potassium: 4.1 mmol/L (ref 3.5–5.2)
Sodium: 140 mmol/L (ref 134–144)
Total Protein: 6.4 g/dL (ref 6.0–8.5)
eGFR: 87 mL/min/1.73 (ref >59)

## 2024-06-23 LAB — IRON,TIBC AND FERRITIN PANEL
Ferritin: 19 ng/mL (ref 15–150)
Iron Saturation: 14 % — ABNORMAL LOW (ref 15–55)
Iron: 47 ug/dL (ref 27–159)
Total Iron Binding Capacity: 343 ug/dL (ref 250–450)
UIBC: 296 ug/dL (ref 131–425)

## 2024-06-23 LAB — CBC WITH DIFFERENTIAL/PLATELET
Basophils Absolute: 0 x10E3/uL (ref 0.0–0.2)
Basos: 1 %
EOS (ABSOLUTE): 0.1 x10E3/uL (ref 0.0–0.4)
Eos: 1 %
Hematocrit: 39.6 % (ref 34.0–46.6)
Hemoglobin: 12.9 g/dL (ref 11.1–15.9)
Immature Grans (Abs): 0 x10E3/uL (ref 0.0–0.1)
Immature Granulocytes: 0 %
Lymphocytes Absolute: 1.4 x10E3/uL (ref 0.7–3.1)
Lymphs: 24 %
MCH: 30.8 pg (ref 26.6–33.0)
MCHC: 32.6 g/dL (ref 31.5–35.7)
MCV: 95 fL (ref 79–97)
Monocytes Absolute: 0.5 x10E3/uL (ref 0.1–0.9)
Monocytes: 8 %
Neutrophils Absolute: 3.9 x10E3/uL (ref 1.4–7.0)
Neutrophils: 66 %
Platelets: 324 x10E3/uL (ref 150–450)
RBC: 4.19 x10E6/uL (ref 3.77–5.28)
RDW: 12.4 % (ref 11.7–15.4)
WBC: 5.9 x10E3/uL (ref 3.4–10.8)

## 2024-06-23 LAB — HEMOGLOBIN A1C
Est. average glucose Bld gHb Est-mCnc: 108 mg/dL
Hgb A1c MFr Bld: 5.4 % (ref 4.8–5.6)

## 2024-06-23 LAB — LIPID PANEL
Chol/HDL Ratio: 3.9 ratio (ref 0.0–4.4)
Cholesterol, Total: 210 mg/dL — ABNORMAL HIGH (ref 100–199)
HDL: 54 mg/dL (ref >39)
LDL Chol Calc (NIH): 134 mg/dL — ABNORMAL HIGH (ref 0–99)
Triglycerides: 125 mg/dL (ref 0–149)
VLDL Cholesterol Cal: 22 mg/dL (ref 5–40)

## 2024-06-23 LAB — VITAMIN D 25 HYDROXY (VIT D DEFICIENCY, FRACTURES): Vit D, 25-Hydroxy: 30.2 ng/mL (ref 30.0–100.0)

## 2024-06-23 LAB — TSH: TSH: 2.02 u[IU]/mL (ref 0.450–4.500)

## 2024-06-23 LAB — VITAMIN B12: Vitamin B-12: >2000 pg/mL — ABNORMAL HIGH (ref 232–1245)

## 2024-07-09 ENCOUNTER — Ambulatory Visit: Admitting: Cardiology

## 2024-09-22 ENCOUNTER — Encounter
